# Patient Record
Sex: Female | Born: 1960 | Race: White | Hispanic: No | Marital: Married | Smoking: Never smoker
Health system: Southern US, Community
[De-identification: ages and names within clinical notes are randomized; demographics above are authoritative.]

## PROBLEM LIST (undated history)

## (undated) ENCOUNTER — Ambulatory Visit: Admission: EM | Discharge status: Absent without leave | Payer: BLUE CROSS/BLUE SHIELD

## (undated) DIAGNOSIS — E785 Hyperlipidemia, unspecified: Secondary | ICD-10-CM

## (undated) DIAGNOSIS — K635 Polyp of colon: Secondary | ICD-10-CM

## (undated) DIAGNOSIS — C4491 Basal cell carcinoma of skin, unspecified: Secondary | ICD-10-CM

## (undated) DIAGNOSIS — N809 Endometriosis, unspecified: Secondary | ICD-10-CM

## (undated) DIAGNOSIS — T7840XA Allergy, unspecified, initial encounter: Secondary | ICD-10-CM

## (undated) DIAGNOSIS — I10 Essential (primary) hypertension: Secondary | ICD-10-CM

## (undated) DIAGNOSIS — M8580 Other specified disorders of bone density and structure, unspecified site: Secondary | ICD-10-CM

## (undated) DIAGNOSIS — H348192 Central retinal vein occlusion, unspecified eye, stable: Secondary | ICD-10-CM

## (undated) DIAGNOSIS — I2510 Atherosclerotic heart disease of native coronary artery without angina pectoris: Secondary | ICD-10-CM

## (undated) DIAGNOSIS — I219 Acute myocardial infarction, unspecified: Secondary | ICD-10-CM

## (undated) HISTORY — DX: Basal cell carcinoma of skin, unspecified: C44.91

## (undated) HISTORY — DX: Endometriosis, unspecified: N80.9

## (undated) HISTORY — PX: COLONOSCOPY: SHX174

## (undated) HISTORY — DX: Allergy, unspecified, initial encounter: T78.40XA

## (undated) HISTORY — DX: Polyp of colon: K63.5

## (undated) HISTORY — PX: POLYPECTOMY: SHX149

## (undated) HISTORY — DX: Acute myocardial infarction, unspecified: I21.9

## (undated) HISTORY — DX: Essential (primary) hypertension: I10

## (undated) HISTORY — DX: Other specified disorders of bone density and structure, unspecified site: M85.80

## (undated) HISTORY — PX: APPENDECTOMY: SHX54

---

## 1987-06-10 DIAGNOSIS — N809 Endometriosis, unspecified: Secondary | ICD-10-CM

## 1987-06-10 HISTORY — DX: Endometriosis, unspecified: N80.9

## 1999-02-27 ENCOUNTER — Inpatient Hospital Stay (HOSPITAL_COMMUNITY): Admission: AD | Admit: 1999-02-27 | Discharge: 1999-02-27 | Payer: Self-pay | Admitting: Obstetrics & Gynecology

## 1999-03-14 ENCOUNTER — Inpatient Hospital Stay (HOSPITAL_COMMUNITY): Admission: AD | Admit: 1999-03-14 | Discharge: 1999-03-17 | Payer: Self-pay | Admitting: Obstetrics and Gynecology

## 1999-03-14 ENCOUNTER — Encounter (INDEPENDENT_AMBULATORY_CARE_PROVIDER_SITE_OTHER): Payer: Self-pay | Admitting: Specialist

## 1999-03-18 ENCOUNTER — Encounter (HOSPITAL_COMMUNITY): Admission: RE | Admit: 1999-03-18 | Discharge: 1999-06-16 | Payer: Self-pay | Admitting: Obstetrics & Gynecology

## 1999-05-20 ENCOUNTER — Other Ambulatory Visit: Admission: RE | Admit: 1999-05-20 | Discharge: 1999-05-20 | Payer: Self-pay | Admitting: Obstetrics & Gynecology

## 1999-10-30 ENCOUNTER — Ambulatory Visit (HOSPITAL_COMMUNITY): Admission: RE | Admit: 1999-10-30 | Discharge: 1999-10-30 | Payer: Self-pay | Admitting: *Deleted

## 1999-10-30 ENCOUNTER — Encounter: Payer: Self-pay | Admitting: *Deleted

## 2000-06-30 ENCOUNTER — Other Ambulatory Visit: Admission: RE | Admit: 2000-06-30 | Discharge: 2000-06-30 | Payer: Self-pay | Admitting: Obstetrics & Gynecology

## 2000-06-30 ENCOUNTER — Other Ambulatory Visit: Admission: RE | Admit: 2000-06-30 | Discharge: 2000-06-30 | Payer: Self-pay | Admitting: Family Medicine

## 2000-12-22 ENCOUNTER — Inpatient Hospital Stay (HOSPITAL_COMMUNITY): Admission: AD | Admit: 2000-12-22 | Discharge: 2000-12-22 | Payer: Self-pay | Admitting: Obstetrics & Gynecology

## 2001-02-23 ENCOUNTER — Inpatient Hospital Stay (HOSPITAL_COMMUNITY): Admission: AD | Admit: 2001-02-23 | Discharge: 2001-02-25 | Payer: Self-pay | Admitting: Obstetrics & Gynecology

## 2001-04-01 ENCOUNTER — Other Ambulatory Visit: Admission: RE | Admit: 2001-04-01 | Discharge: 2001-04-01 | Payer: Self-pay | Admitting: Obstetrics & Gynecology

## 2002-03-02 ENCOUNTER — Other Ambulatory Visit: Admission: RE | Admit: 2002-03-02 | Discharge: 2002-03-02 | Payer: Self-pay | Admitting: *Deleted

## 2002-07-20 ENCOUNTER — Encounter: Payer: Self-pay | Admitting: Internal Medicine

## 2003-03-13 ENCOUNTER — Other Ambulatory Visit: Admission: RE | Admit: 2003-03-13 | Discharge: 2003-03-13 | Payer: Self-pay | Admitting: *Deleted

## 2003-08-08 ENCOUNTER — Ambulatory Visit (HOSPITAL_COMMUNITY): Admission: RE | Admit: 2003-08-08 | Discharge: 2003-08-08 | Payer: Self-pay | Admitting: *Deleted

## 2004-03-21 ENCOUNTER — Other Ambulatory Visit: Admission: RE | Admit: 2004-03-21 | Discharge: 2004-03-21 | Payer: Self-pay | Admitting: *Deleted

## 2004-08-13 ENCOUNTER — Ambulatory Visit (HOSPITAL_COMMUNITY): Admission: RE | Admit: 2004-08-13 | Discharge: 2004-08-13 | Payer: Self-pay | Admitting: *Deleted

## 2004-10-22 ENCOUNTER — Encounter: Admission: RE | Admit: 2004-10-22 | Discharge: 2004-10-22 | Payer: Self-pay | Admitting: *Deleted

## 2005-03-25 ENCOUNTER — Other Ambulatory Visit: Admission: RE | Admit: 2005-03-25 | Discharge: 2005-03-25 | Payer: Self-pay | Admitting: Obstetrics and Gynecology

## 2006-03-31 ENCOUNTER — Other Ambulatory Visit: Admission: RE | Admit: 2006-03-31 | Discharge: 2006-03-31 | Payer: Self-pay | Admitting: Obstetrics & Gynecology

## 2006-09-22 ENCOUNTER — Encounter: Admission: RE | Admit: 2006-09-22 | Discharge: 2006-09-22 | Payer: Self-pay | Admitting: Obstetrics & Gynecology

## 2006-09-29 ENCOUNTER — Encounter: Admission: RE | Admit: 2006-09-29 | Discharge: 2006-09-29 | Payer: Self-pay | Admitting: Obstetrics & Gynecology

## 2007-04-08 ENCOUNTER — Other Ambulatory Visit: Admission: RE | Admit: 2007-04-08 | Discharge: 2007-04-08 | Payer: Self-pay | Admitting: Obstetrics & Gynecology

## 2007-04-08 LAB — HM PAP SMEAR

## 2007-05-10 HISTORY — PX: TOTAL ABDOMINAL HYSTERECTOMY W/ BILATERAL SALPINGOOPHORECTOMY: SHX83

## 2007-06-07 ENCOUNTER — Inpatient Hospital Stay (HOSPITAL_COMMUNITY): Admission: RE | Admit: 2007-06-07 | Discharge: 2007-06-09 | Payer: Self-pay | Admitting: Obstetrics & Gynecology

## 2007-06-07 ENCOUNTER — Encounter: Payer: Self-pay | Admitting: Obstetrics & Gynecology

## 2007-09-28 ENCOUNTER — Encounter: Admission: RE | Admit: 2007-09-28 | Discharge: 2007-09-28 | Payer: Self-pay | Admitting: Obstetrics & Gynecology

## 2008-06-19 ENCOUNTER — Telehealth: Payer: Self-pay | Admitting: Internal Medicine

## 2008-06-29 DIAGNOSIS — Z860100 Personal history of colon polyps, unspecified: Secondary | ICD-10-CM | POA: Insufficient documentation

## 2008-07-05 ENCOUNTER — Ambulatory Visit: Payer: Self-pay | Admitting: Internal Medicine

## 2008-07-05 DIAGNOSIS — K625 Hemorrhage of anus and rectum: Secondary | ICD-10-CM | POA: Insufficient documentation

## 2008-07-05 DIAGNOSIS — N809 Endometriosis, unspecified: Secondary | ICD-10-CM | POA: Insufficient documentation

## 2008-07-11 ENCOUNTER — Ambulatory Visit: Payer: Self-pay | Admitting: Internal Medicine

## 2008-10-24 ENCOUNTER — Encounter: Admission: RE | Admit: 2008-10-24 | Discharge: 2008-10-24 | Payer: Self-pay | Admitting: Obstetrics & Gynecology

## 2008-10-25 ENCOUNTER — Encounter: Admission: RE | Admit: 2008-10-25 | Discharge: 2008-10-25 | Payer: Self-pay | Admitting: Obstetrics & Gynecology

## 2009-10-25 ENCOUNTER — Encounter: Admission: RE | Admit: 2009-10-25 | Discharge: 2009-10-25 | Payer: Self-pay | Admitting: Obstetrics & Gynecology

## 2009-10-29 ENCOUNTER — Encounter: Admission: RE | Admit: 2009-10-29 | Discharge: 2009-10-29 | Payer: Self-pay | Admitting: Obstetrics & Gynecology

## 2009-10-29 LAB — HM DEXA SCAN: HM Dexa Scan: NORMAL

## 2010-06-30 ENCOUNTER — Encounter: Payer: Self-pay | Admitting: Obstetrics & Gynecology

## 2010-07-01 ENCOUNTER — Encounter: Payer: Self-pay | Admitting: Obstetrics & Gynecology

## 2010-07-09 NOTE — Procedures (Signed)
Summary: colonoscopy   Colonoscopy  Procedure date:  07/11/2008  Findings:      Location:  [facility].      PATIENT:  Kaylee Banks, Kaylee Banks  MR#:  [id number] BIRTHDATE:   07-19-1960   GENDER:   female  ENDOSCOPIST:   Hedwig Morton. Juanda Chance, MD Referred by: Holley Bouche, M.D.  PROCEDURE DATE:  07/11/2008 PROCEDURE:  Colonoscopy, diagnostic ASA CLASS:   Class I INDICATIONS: rectal bleeding anoscopic exam in the office failed to demonstrate site of the bleeding  hx of colon polyp 1993, no polyps 1995,2004  positive F hx of colon polyps  MEDICATIONS:    Versed 7 mg, Fentanyl 50 mcg  DESCRIPTION OF PROCEDURE:   After the risks benefits and alternatives of the procedure were thoroughly explained, informed consent was obtained.  Digital rectal exam was performed and revealed no rectal masses.   The LB PCF-H180AL [id number] endoscope was introduced through the anus and advanced to the cecum, which was identified by both the appendix and ileocecal valve, without limitations.  The quality of the prep was good, using MiraLax.  The instrument was then slowly withdrawn as the colon was fully examined. <<PROCEDUREIMAGES>>      <<OLD IMAGES>>  FINDINGS:  A normal appearing cecum, ileocecal valve, and appendiceal orifice were identified. The ascending, transverse, descending, sigmoid colon, and rectum appeared unremarkable (see image1, image2, image3, and image4). prominent anal papillae, no hems   Retroflexed views in the rectum revealed no abnormalities.    The scope was then withdrawn from the patient and the procedure completed.  COMPLICATIONS:   None  ENDOSCOPIC IMPRESSION:  1) Normal colon  no polyps RECOMMENDATIONS:  1) high fiber diet  2) hemoccults q 2-3 years  suspect anal source of bleeding which cleared up with topical steroids,  REPEAT EXAM:   In 7 year(s) for.   _______________________________ Hedwig Morton. Juanda Chance, MD  CC:

## 2010-07-09 NOTE — Assessment & Plan Note (Signed)
Summary: RECTAL BLEEDING, F/U FROM TRIAGE ON 06-19-08.      Kaylee Banks   History of Present Illness Visit Type: new patient Primary GI MD: Lina Sar MD Primary Provider: Holley Bouche, MD Chief Complaint: f/u triage with rectal bleeding x 2 weeks ago. Pt was recommended to start sitz bath, high fiber diet, and preperation H. Pt has done these things with a lot of improvement. Pt has not seen blood in 3 days. no abd pain, fever. History of Present Illness:   This is a 50 year old white female with an episode of  bright red blood per rectum which occurred 2 weeks ago and was associated with rectal pain and external irritation. She has been using Preparation H cream with much improvement. There has been no recurrence of bleeding and the pain has subsided. Patient has a history of a colon polyp seen on colonoscopy in 1993. She had subsequent colonoscopies in 1995 and 2004.  Patient has a family history of colon polyps. Her recall colonoscopy is scheduled for February 2011. In December 2008, patient underwent a total abdominal hysterectomy. She has been on calcium supplements which cause her to be constipated.   GI Review of Systems      Denies abdominal pain, acid reflux, belching, bloating, chest pain, dysphagia with liquids, dysphagia with solids, heartburn, loss of appetite, nausea, vomiting, vomiting blood, weight loss, and  weight gain.      Reports constipation.     Denies anal fissure, black tarry stools, change in bowel habit, diarrhea, diverticulosis, fecal incontinence, heme positive stool, hemorrhoids, irritable bowel syndrome, jaundice, light color stool, liver problems, rectal bleeding, and  rectal pain.     Prior Medications Reviewed Using: List Brought by Patient  Updated Prior Medication List: ASPIRIN 81 MG  TABS (ASPIRIN) one tablet by mouth once daily CALCIUM CARBONATE-VITAMIN D 600-400 MG-UNIT  TABS (CALCIUM CARBONATE-VITAMIN D) one tablet by mouth once daily CALCIUM 1200  1200-1000 MG-UNIT CHEW (CALCIUM CARBONATE-VIT D-MIN) one tablet by mouth once daily VITAMIN D 1000 UNIT TABS (CHOLECALCIFEROL) one tablet by mouth once daily MULTIVITAMINS   TABS (MULTIPLE VITAMIN) one tablet by mouth once daily as needed  Current Allergies (reviewed today): ! PERCOCET ! * DILAUDID  Past Medical History:    Reviewed history from 06/29/2008 and no changes required:       Current Problems:        ENDOMETRIOSIS (ICD-617.9)       COLONIC POLYPS, HX OF (ICD-V12.72)                 Past Surgical History:    Reviewed history from 06/29/2008 and no changes required:       C-Section x 2       Appendectomy 05/2007       Hysterectomy   Family History:    Reviewed history from 06/29/2008 and no changes required:       No FH of Colon Cancer:       Family History of Stomach Cancer: Paternal Grandfather       Family History of Colon Polyps: Father, Paternal Grandfather       Family History of Diabetes: Paternal Programme researcher, broadcasting/film/video       Family History of Uterine Cancer: Paternal Grandmother       Leiomyosarcoma of small bowel: Grandfather  Social History:    Reviewed history and no changes required:       Married       Systems developer       Patient has  never smoked.        Alcohol Use - no       Daily Caffeine Use       Illicit Drug Use - no   Risk Factors:  Tobacco use:  never Drug use:  no Alcohol use:  no   Review of Systems  The patient denies allergy/sinus, anemia, anxiety-new, arthritis/joint pain, back pain, blood in urine, breast changes/lumps, change in vision, confusion, cough, coughing up blood, depression-new, fainting, fatigue, fever, headaches-new, hearing problems, heart murmur, heart rhythm changes, itching, menstrual pain, muscle pains/cramps, night sweats, nosebleeds, pregnancy symptoms, shortness of breath, skin rash, sleeping problems, sore throat, swelling of feet/legs, swollen lymph glands, thirst - excessive , urination - excessive ,  urination changes/pain, urine leakage, vision changes, and voice change.     Vital Signs:  Patient Profile:   50 Years Old Female Height:     68 inches Weight:      141.25 pounds BMI:     21.55 Pulse rate:   74 / minute Pulse rhythm:   regular BP sitting:   126 / 76  (right arm) Cuff size:   regular  Vitals Entered By: Christie Nottingham CMA (July 05, 2008 8:38 AM)                  Physical Exam  General:     Well developed, well nourished, no acute distress. Lungs:     Clear throughout to auscultation. Heart:     Regular rate and rhythm; no murmurs, rubs or bruits. Abdomen:     Soft, nontender and nondistended. No masses, hepatosplenomegaly or hernias noted. Normal bowel sounds. Rectal:     perianal area appears normal. Rectal tone is normal. On digital exam, there is no irregularity of the anal canal and no tenderness. Anoscopic exam reveals very small internal hemorrhoids with no evidence of an anal fissure. There are minimal skin tags externally. There is no stool in the ampulla.    Impression & Recommendations:  Problem # 1:  COLONIC POLYPS, HX OF (ICD-V12.72) Acute episode of rectal bleeding. I am unable to demonstrate the source of bleeding today on anoscopic exam. Because it has been 2 weeks since the episode, it is possible that the fissure or hemorrhoid has healed, however,with her personal history of colon polyps and family history of colon polyps, she will need to have a colonoscopy to rule out the possibility of a left colon lesion. I have discussed this with the patient and she agrees to proceed with a colonoscopic exam. In the meantime, she will continue using her topical hemorrhoidal preparation. Orders: Colonoscopy (Colon)    Patient Instructions: 1)  continue Preparation H cream 2)  Schedule colonoscopy 3)  Calcium magnesium preparation to prevent constipating effect of the calcium. She may also take a stool softener.  4)  Copy Sent To:Dr  R.Harris    Prescriptions: DULCOLAX 5 MG  TBEC (BISACODYL) Day before procedure take 2 at 3pm and 2 at 8pm.  #4 x 0   Entered by:   Hortense Ramal CMA   Authorized by:   Hart Carwin MD   Signed by:   Hortense Ramal CMA on 07/05/2008   Method used:   Electronically to        [facility]  [address] [id number] (retail)       [address]       [place], [place]  [address]  Ph: [phone] or [phone]       Fax: [phone]   RxID:   [id number] REGLAN 10 MG  TABS (METOCLOPRAMIDE HCL) As per prep instructions.  #2 x 0   Entered by:   Hortense Ramal CMA   Authorized by:   Hart Carwin MD   Signed by:   Hortense Ramal CMA on 07/05/2008   Method used:   Electronically to        [id number] (retail)       [address]       [place], [place]  [address]       Ph: [phone] or [phone]       Fax: [phone]   RxID:   [id number] MIRALAX   POWD (POLYETHYLENE GLYCOL 3350) As per prep  instructions.  #255gm x 0   Entered by:   Hortense Ramal CMA   Authorized by:   Hart Carwin MD   Signed by:   Hortense Ramal CMA on 07/05/2008   Method used:   Electronically to        [id number] (retail)       [address]       [place], [place]  [address]       Ph: [phone] or [phone]       Fax: [phone]   RxID:   [id number]

## 2010-09-04 ENCOUNTER — Other Ambulatory Visit: Payer: Self-pay | Admitting: Family Medicine

## 2010-09-04 DIAGNOSIS — Z1231 Encounter for screening mammogram for malignant neoplasm of breast: Secondary | ICD-10-CM

## 2010-10-22 NOTE — Op Note (Signed)
NAMEWYNONA, Kaylee Banks           ACCOUNT NO.:  [id number]   MEDICAL RECORD NO.:  [id number]          PATIENT TYPE:  INP   LOCATION:  9320                          FACILITY:  [facility]   PHYSICIAN:  M. Leda Quail, MD  DATE OF BIRTH:  1961-05-12   DATE OF PROCEDURE:  06/07/2007  DATE OF DISCHARGE:                               OPERATIVE REPORT   PREOPERATIVE DIAGNOSES:  67. A 50 year old G2, P2 married white female with menorrhagia.  2. Dysfunctional uterine bleeding.  3. Dysmenorrhea.  4. Fibroids and probable adenomyosis noted on ultrasound.  5. History of mild endometriosis.   POSTOPERATIVE DIAGNOSES:  1. See above.  2. Extensive endometriosis which obliterated the posterior cul-de-sac,      was also present on the bowels, ovaries and fallopian tubes as well      as caused bladder scarring.   PROCEDURE:  TAH-BSO and extensive lysis of adhesions.   SURGEON:  M. Leda Quail, MD   ASSISTANT:  Meredeth Ide   ANESTHESIA:  General tracheal.   FINDINGS:  Extensive endometriosis as stated above.  Specifically there  was endometriosis present on small bowel.  The endometriosis had caused  obliteration of the posterior cul-de-sac with both ovaries were stuck to  the pelvic sidewall.  The bladder was stuck up on the uterus.  Both  fallopian tubes were enlarged and clubbed.   SPECIMENS:  Uterus, ovaries, cervix, tubes.   DISPOSITION:  Specimen sent pathology.   ESTIMATED BLOOD LOSS:  900 mL (this includes endometrioma fluid as  well).   INTRAOPERATIVE FLUIDS:  3100 mL of LR and 500 mL of Hespan.   URINE OUTPUT:  300 mL.   COMPLICATIONS:  None.   INDICATIONS:  Kaylee Banks is a very pleasant 50 year old G2, P2 married  white female who has a long history of with menorrhagia and  dysmenorrhea.  This has worsened significantly over the last year and  she has decided to proceed with definitive management.  She had I have  discussed routes of the surgery based on her  medical history, previous  history of 2 C-sections.  I felt the abdominal approach is the best.   PROCEDURE:  The patient was to the operating room.  She was placed on  the supine position.  General endotracheal anesthesia was administered  by anesthesia staff without difficulty.  Legs were bent a little bit  over a pillow.  Once the patient was asleep legs were placed in the frog-  leg position.  Abdomen, perineum, inner thighs and vagina prepped in  normal sterile fashion.  Foley catheter was inserted the bladder under  sterile conditions.  The legs were then straightened.  She was then  draped in normal sterile fashion.   Attention was turned the abdomen.  Previous C-section scars were noted.  A Pfannenstiel skin incision was made with a knife.  This was carried  down through the subcutaneous fat tissue.  Fascia then was identified  and nicked in the midline.  Fascial incision was extended laterally  using curved Mayo scissors.  Kocher clamps were applied to the superior  aspect of fascial incision.  The underlying rectus muscle dissected off  sharply.  In a similar fashion, the inferior aspect of the fascial  incision was elevated and the underlying rectus muscles were dissected  off sharply.  The peritoneum was identified.  It is elevated superiorly  and entered sharply.  The operator's index finger did sweep underneath  the peritoneum and no adhesions were noted.  The peritoneal  incision  was extended superiorly and inferiorly with care taken to note the  location of the bladder.   Survey of the abdomen at this point revealed a quite socked in uterus  with extensive endometriosis all around.  There was only half-dollar  size the area on the uterus at the fundus that appeared completely  normal.  Small bowel was attached to the fundus. The ovaries and  fallopian tubes at this point were not clearly identifiable and there  was extensive bladder adhesions onto the uterus  inferiorly.  No  retractor could be used at this point.  Deaver retractors were used  during the procedure.  Initially the patient's left side appeared to be  a little less adhesed and attention was initially turned to this side.  The left round ligament could be identified.  This was elevated suture  ligated #0 Vicryl.  The round ligament was incised.  There was fimbria  that could be noted on the left fallopian tube.  This was grasped with a  Babcock clamp and using sharp and blunt dissection, the fallopian tube  on the left side was ultimately dissected out.  The utero-ovarian  pedicle at this point could be identified at the complete edge of the  ovary could not.  Again sharp and blunt dissection was performed until  the left IP ligament could be identified and was clamped with Heaney  clamp.  This was transected and suture ligated #0 Vicryl and a second  stitch of #0 Vicryl.  At this point attention was turned inferiorly.  Sharp dissection was used to remove the dense adhesions of the bladder  or peritoneum covering the bladder off the uterus.  Once these initial  adhesions were cut, a true bladder flap was easily made.  The bladder  was pushed down the cervix and clear white glistening tissue of the  cervix was noted.  There was bowel superiorly on the uterus and this was  dissected off of both with sharp and blunt dissection as well.  At this  point skeletonization of the left uterine artery was possible.  This was  performed and the left uterine artery was clamped, transected and suture  ligated with two stitches of #0 Vicryl.   Attention was turned to the right side.  The round ligament was  identified and suture ligated #0 Vicryl.  The ovary and the fallopian  tube were stuck down the posterior cul-de-sac.  Extensive lysis of  adhesions was necessary to free this and identify the IP ligament.  Small bowel and the cecum was attached to the fundus.  Once the cecum  was freed, the  appendix appeared to be only a stump.  There was no bowel  content leakage.  Patient has not had an appendectomy but there was  really nothing but a stump present.  Once this was out of the way, the  IP ligament was identified it was clamped and transected and doubly  suture ligated with a free tie of #0 Vicryl and a stitch tie.  The  posterior cul-de-sac at this point could be dissected bluntly  with index  finger to separate the uterus from the cul-de-sac.  The base of cervix  was identified using this manner.  Again with further dissection the  right uterine artery could be identified.  This was clamped and suture  ligated #0 Vicryl.  Bladder was well out of the way at this point in the  procedure.  Then the cardinal ligaments could be serially clamped,  transected, and suture ligated with #0 Vicryl.  The left side was a  little freer than the right side.  The uterosacral ligaments at this  point were clamped, transected, and suture ligated with #0 Vicryl.  Curved Heaney clamp could be placed around the base of the cervix at  this point on the left side.  This was performed and then the pedicle  cut.  The vagina was entered at this point.  A Heaney stitch of #0  Vicryl was placed at this pedicle.  Then using Jorgenson scissors and  hugging the cervix, vaginal mucosa was circumferentially incised around  the cervix.  Multiple figure-of-eight sutures of #0 Vicryl were used to  close the cuff as cervix was quite bulbous.  At this point copious  amounts of warm normal saline were used to irrigate the pelvis.  There  was some small bleeding that was noted on the bladder flap and a figure-  of-eight suture of #2-0 Vicryl on an SH needle was necessary to obtain  hemostasis.  As well there was a small amount of bleeding on the cuff.  An additional figure-of-eight suture of #0 Vicryl was required to obtain  hemostasis.  Again the pelvis was irrigated.  It did appear relatively  dry but quite oozy  due to the amount of endometriosis was present.  The  bowel was visualized.  There did not appear to be any areas of weakened  wall edge of the bowel.  Then 5 mL of indigo carmine was given  intravenously as further irrigation was performed, blue dye was watched  for to ensure that there was no ureteral or bladder injury.  No injury  was identified.  Blue did come fairly quickly out of the bladder and  into the catheter bag.   At this point, an Interceed was placed into the pelvis and a #7 JP drain  was obtained.  The incision was made in the skin on the right side.  A  Kelly clamp was used to traverse the fascia and the JP drain was placed  in the pelvis.  This drain was sewn in place with 2-0 silk.   The peritoneum at this point closed with a running stitch of #0 Vicryl.  Two On-Q pump tubings were obtained.  The first placed subfascially on  the right side.  The tubing was attached to the skin using Steri-Strips.  The subvaginal tissue and rectus muscles were visualized.  No bleeding  noted.  The fascia was closed and the corners run to the midline  bilaterally.  A subcutaneous On-Q pump was then placed on the patient's  left.  At this point skin was closed with subcuticular stitch of #3-0  Vicryl.  This was done after the subcutaneous fat tissue was irrigated  and no bleeding was noted.  The skin was cleansed, benzoin and Steri-  Strips were applied.  Then 5 mL 1% lidocaine was instilled  subcutaneously and 10 mL of 1% lidocaine was instilled subfascially.   Sponge, lap, needle and sponge counts were correct x2.  The patient  tolerated  procedure well.  She was extubated and awakened from  anesthesia without difficulty.  She was taken to the recovery room in  stable condition.      Lum Keas, MD  Electronically Signed     MSM/MEDQ  D:  06/07/2007  T:  06/08/2007  Job:  [id number]

## 2010-10-22 NOTE — Discharge Summary (Signed)
Kaylee Banks, Kaylee Banks           ACCOUNT NO.:  [id number]   MEDICAL RECORD NO.:  [id number]          PATIENT TYPE:  INP   LOCATION:  9320                          FACILITY:  [facility]   PHYSICIAN:  M. Leda Quail, MD  DATE OF BIRTH:  1960-07-27   DATE OF ADMISSION:  06/07/2007  DATE OF DISCHARGE:  06/09/2007                               DISCHARGE SUMMARY   ADMISSION DIAGNOSES:  1. This is a 50 year old, G2 P2 married, white female with a history      of metromenorrhagia.  2. History of endometriosis.  3. History of retinal vein occlusion with a negative hematologic      workup.  4. History of cesarean section x2.   DISCHARGE DIAGNOSES:  1. This is a 50 year old, G2 P2 married, white female with a history      of metromenorrhagia.  2. History of endometriosis.  3. History of retinal vein occlusion with a negative hematologic      workup.  4. History of cesarean section x2.  5. Extensive endometriosis of the entire pelvis involving the ovaries,      bowels, anterior and posterior cul de sac, bladders and pelvic side      walls.   HISTORY OF PRESENT ILLNESS:  A written H&P is in the chart.  In brief,  [name] is a very nice 50 year old, white female who has a history  of metromenorrhagia.  She has decided to proceed with definitive  management via hysterectomy.  She and I discussed treatment options in  the clinic and she ultimately decided to proceed with a hysterectomy.  She has been counseled about risks and benefits.  She is here to proceed  today.   HOSPITAL COURSE:  The patient is admitted to same day surgery.  She is  taken to the operating room for TAH-BSO.  Lysis of extensive adhesions  was performed.  Surgery was lengthy because of the amount of  endometriosis and she had about 900 ml of blood loss, but she was quite  stable during the procedure and did well.  She did have an  intraoperative H&H drawn which was in the 10 range, but we decided, at  this point,  not to give her blood as she was quite hemodynamically  stable.  After surgery was completed, she was transferred to the  recovery room and, after appropriate time there, to the third floor.  She has an On-Q pump placed for pain.  She did have a JP on the right  side that was placed and a peritoneal drain.  She also had a Dilaudid  PCA for pain management.  She had a Foley catheter.  She was seen on the  evening of postoperative day 0.  She was doing well.  She had very quiet  bowel sounds.  She had excellent pain control.  She was having some  issues with nausea.  On the morning of postoperative day 1, she reports  the nausea had improved.  Pain was still well controlled.  Vital signs  were stable.  She was afebrile.  She had made about 3000 ml of clear  urine output.  Hemoglobin was 8.4, white count 8.9, platelets 193.  Electrolytes:  136 sodium, 3.3 potassium, 7 BUN, 0.7 creatinine, glucose  154.  Her Foley was removed and she was able to void well.  She had  excellent bowel sounds and she was advanced to a regular diet without  difficulty.  About noon, her PCA was removed and her nausea completely  resolved which lead [place] to believe that the nausea was resulting from the  Dilaudid, not anything else.  She was able to ambulate without  difficulty and, by the end of postoperative day 1, she was voiding well,  eating well, taking all pain medicines by mouth and ambulating without  difficulty.  She was seen on the morning of postoperative day 2 without  complaints.  She had not passed gas yet, but she continued to have  excellent bowel sounds.  She was having a little bit of itching which I  feel is probably due to the Percocet, so she was switched to oral  Vicodin for pain management.  At this point, she met all criteria for  discharge.  Her On-Q pumps were removed.  The tubing on the left, when  it was removed, did bleed some, but his was stopped easily with  pressure.  Her JP was removed  on the right and it was closed with  Dermabond.  At this point, I felt discharge was appropriate.   DISCHARGE INSTRUCTIONS:  These were provided in written and verbal form.  She will use Motrin 800 mg one every 8 hours p.r.n. pain and Vicodin  5/500 one to 2 p.o. q.4-6h. p.r.n. pain as need.  She will start on  Colace for a stool softener.  She will start Milk of Magnesia or MiraLax  on Friday if she has not had a bowel movement.  She will be seen in one  week in my office and she already has an appointment for this.  She is  to call us specifically with fever, increased vaginal bleeding, any  unusual vaginal discharge, redness or drainage from her incision.      Lum Keas, MD  Electronically Signed     MSM/MEDQ  D:  06/09/2007  T:  06/09/2007  Job:  [id number]

## 2010-10-25 NOTE — H&P (Signed)
[facility] of [facility]  Patient:    Kaylee Banks, Kaylee Banks Visit Number: [id number] MRN: [id number]          Service Type: OBS Location: [facility] Attending Physician:  Soledad Gerlach Dictated by:   Freddy Finner, M.D. Admit Date:  12/22/2000 Discharge Date: 12/22/2000                           History and Physical  ADMISSION DIAGNOSES:          1. Intrauterine pregnancy at term.                               2. Surgically scarred uterus.                               3. Refused vaginal birth after cesarean.  HISTORY:                      The patient is a 50 year old white married female gravida 2, para 1, who was delivered by cesarean for breech with her first pregnancy in October 2000.  She has had an uncomplicated prenatal course.  She is now admitted at term for a repeat cesarean delivery.  REVIEW OF SYSTEMS:            At this time is negative.  PAST MEDICAL HISTORY/FAMILY HISTORY:  Are recorded in detail in the  prenatal summary, and will not be repeated.  PHYSICAL EXAMINATION:  HEENT:                        Grossly within normal limits.  VITAL SIGNS:                  Blood pressure 120/76.  Weight is 186 pounds.  CHEST:                        Clear to auscultation.  HEART:                        Normal sinus rhythm without murmurs, rubs, or gallops.  ABDOMEN:                      Gravid.  Estimated fetal size of 8 pounds.  PELVIC:                       Cervix is long, posterior, closed at the internal os.  Vertex is floating.  EXTREMITIES:                  Without cyanosis, clubbing, or edema.  ASSESSMENT:                   Intrauterine pregnancy at term, with surgically                               scarred uterus, requesting repeat surgical                               delivery.  PLAN:  Cesarean delivery. Dictated by:   Freddy Finner, M.D. Attending Physician:  Soledad Gerlach DD:  02/22/01 TD:   02/22/01 Job: [id number]

## 2010-10-25 NOTE — Discharge Summary (Signed)
[facility]  Patient:    Kaylee Banks, Kaylee Banks Visit Number: [id number] MRN: [id number]          Service Type: OBS Location: 910A 9109 01 Attending Physician:  Minette Headland Dictated by:   Danie Chandler, R.N. Admit Date:  02/23/2001 Disc. Date: 02/25/01                             Discharge Summary  ADMISSION DIAGNOSES:          Intrauterine pregnancy at term with surgically scarred uterus.  DISCHARGE DIAGNOSIS:          Intrauterine pregnancy at term with surgically scarred uterus.  PROCEDURES:                   On February 23, 2001, repeat low transverse cervical cesarean section.  HISTORY OF PRESENT ILLNESS:   Please see dictated H&P.  HOSPITAL COURSE:              The patient was taken to the operating room and underwent the above-named procedure without complications.  This was productive of a viable female infant with Apgars of 9 at one minute and 9 at five minutes and an arterial cord pH of 7.40.  Postoperatively on day #1 the patient was without complaint.  Her hemoglobin on this day was 11.2, hematocrit 31.9, and white blood cell count 8.4.  On postoperative day #2 she requested discharge home.  She had a good return of bowel function and was tolerating a regular diet.  She was also ambulating well without difficulty and had good pain control.  CONDITION ON DISCHARGE:       Good.  DIET:                         Regular as tolerated.  ACTIVITY:                     No heavy lifting, no driving, no vaginal entry.  FOLLOW-UP:                    She is to follow up in the office for staple removal.  DISCHARGE INSTRUCTIONS:       She is to call for temperature greater than 100 degrees, persistent nausea or vomiting, heavy vaginal bleeding, and/or redness or drainage from the incision site.  DISCHARGE MEDICATIONS:        1. Prenatal vitamin 1 p.o. q.d.                               2. Tylox #25 1-2 p.o. q.4-6h. p.r.n.  pain. Dictated by:   Danie Chandler, R.N. Attending Physician:  Minette Headland DD:  02/25/01 TD:  02/25/01 Job: [id number]

## 2010-10-25 NOTE — Op Note (Signed)
[facility] of [facility]  Patient:    Kaylee Banks, Kaylee Banks Visit Number: [id number] MRN: [id number]          Service Type: OBS Location: 910A 9109 01 Attending Physician:  Minette Headland Dictated by:   Freddy Finner, M.D. Proc. Date: 02/23/01 Admit Date:  02/23/2001                             Operative Report  PREOPERATIVE DIAGNOSIS:  Intrauterine pregnancy at term, surgically scarred uterus.  POSTOPERATIVE DIAGNOSES:  Intrauterine pregnancy at term, surgically scarred uterus with delivery of viable female infant, Apgars of 9 and 9, by Dr. Alison Murray who was in attendance.  Arterial core pH 7.40.  Birth weight 9 pounds 1 ounce.  PROCEDURE:  Repeat low transverse cervical cesarean section.  SURGEON:  Freddy Finner, M.D.  ANESTHESIA: Spinal.  ESTIMATED BLOOD LOSS:  800 cc.  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  COMPLICATIONS:  None.  DETAILS OF THE PRESENCE ILLNESS:  Recorded in the admission note.  The patient was admitted on the morning of surgery and brought to the operating room and placed under adequate spinal anesthesia, and placed in the dorsal recumbent position with elevation of the right hip by 15 degrees. Abdomen was prepped in the usual fashion.  Foley catheter was placed by using sterile technique.  Sterile drapes were applied.  A transverse incision was made through an old lower abdominal transverse scar and carried sharply down to fascia.  The fascia was entered sharply and extended to the extent of the skin incision.  The rectus sheath was developed superiorly and inferiorly with blunt and sharp dissection.  Rectus muscles were divided in the midline.  Peritoneum was entered sharply and extended to the extent of the skin incision.  Bladder blade was placed.  Transverse incision was made in the visceral peritoneum overlying the lower uterine segment.  The lower segment  was very thin.  It was entered sharply without difficulty and extended bluntly in a transverse direction.  Amniotic fluid was clear.  Using a vacuum extractor an easy delivery of a viable female was accomplished.  Cord blood was obtained for arterial gases and for routine venous cord sampling.  Placenta and other parts of conception were removed from the uterus.  This was confirmed by manual exploration of the uterine cavity. Edges of the incision were grasped with a ring forceps. Incision in the lower segment was closed with running locking 0 monacryl suture.  Bladder flap was reapproximated with an interrupted figure-of-eight 0 monacryl.  Irrigation was carried out.  Hemostasis was complete.  There were adhesions in the right adnexa but no visible evidence of an ovary after numerous attempts to find the ovary.   There was a normal left ovary and left fallopian tube.  The uterus itself was normal.  All packs, needles, and instruments were removed.  Counts were correct.  Abdominal incision was closed in layers, running 0 monacryl was used to approximate the peritoneum and reapproximate the rectus muscles. Fascia was closed with running 0 PDS.  Skin was closed with wide skin staples and 1/4 inch Steri-Strips.  The patient tolerated the operative procedure well and was taken to recovery in good condition. Dictated by:   Freddy Finner, M.D. Attending Physician:  Minette Headland DD:  02/23/01 TD:  02/23/01 Job: [id number]

## 2010-10-28 ENCOUNTER — Ambulatory Visit
Admission: RE | Admit: 2010-10-28 | Discharge: 2010-10-28 | Disposition: A | Discharge status: Home / Self Care | Payer: BC Managed Care – PPO | Source: Ambulatory Visit | Attending: Family Medicine | Admitting: Family Medicine

## 2010-10-28 DIAGNOSIS — Z1231 Encounter for screening mammogram for malignant neoplasm of breast: Secondary | ICD-10-CM

## 2011-03-14 LAB — PREGNANCY, URINE: Preg Test, Ur: NEGATIVE

## 2011-03-14 LAB — CROSSMATCH
ABO/RH(D): A POS
Antibody Screen: NEGATIVE

## 2011-03-14 LAB — BASIC METABOLIC PANEL
BUN: 10
BUN: 7
CO2: 29
CO2: 29
Calcium: 7.7 — ABNORMAL LOW
Calcium: 9
Chloride: 103
Chloride: 103
Creatinine, Ser: 0.66
Creatinine, Ser: 0.72
GFR calc Af Amer: >60
GFR calc Af Amer: >60
GFR calc non Af Amer: >60
GFR calc non Af Amer: >60
Glucose, Bld: 154 — ABNORMAL HIGH
Glucose, Bld: 93
Potassium: 3.3 — ABNORMAL LOW
Potassium: 3.3 — ABNORMAL LOW
Sodium: 136
Sodium: 136

## 2011-03-14 LAB — CBC
HCT: 24 — ABNORMAL LOW
HCT: 37.1
Hemoglobin: 12.9
Hemoglobin: 8.4 — ABNORMAL LOW
MCHC: 34.7
MCHC: 35
MCV: 88.5
MCV: 88.8
Platelets: 193
Platelets: 248
RBC: 2.71 — ABNORMAL LOW
RBC: 4.18
RDW: 12.3
RDW: 12.3
WBC: 5.9
WBC: 8.9

## 2011-03-14 LAB — URINALYSIS, ROUTINE W REFLEX MICROSCOPIC
Bilirubin Urine: NEGATIVE
Glucose, UA: NEGATIVE
Ketones, ur: NEGATIVE
Leukocytes, UA: NEGATIVE
Nitrite: NEGATIVE
Protein, ur: NEGATIVE
Specific Gravity, Urine: 1.025
Urobilinogen, UA: 0.2
pH: 6

## 2011-03-14 LAB — URINE MICROSCOPIC-ADD ON

## 2011-03-14 LAB — HEMOGLOBIN AND HEMATOCRIT, BLOOD
HCT: 27.3 — ABNORMAL LOW
HCT: 28.5 — ABNORMAL LOW
Hemoglobin: 10 — ABNORMAL LOW
Hemoglobin: 9.7 — ABNORMAL LOW

## 2011-03-14 LAB — ABO/RH: ABO/RH(D): A POS

## 2011-05-09 ENCOUNTER — Emergency Department (HOSPITAL_BASED_OUTPATIENT_CLINIC_OR_DEPARTMENT_OTHER)
Admission: EM | Admit: 2011-05-09 | Discharge: 2011-05-09 | Disposition: A | Discharge status: Home / Self Care | Payer: BC Managed Care – PPO | Attending: Emergency Medicine | Admitting: Emergency Medicine

## 2011-05-09 ENCOUNTER — Emergency Department (INDEPENDENT_AMBULATORY_CARE_PROVIDER_SITE_OTHER): Payer: BC Managed Care – PPO

## 2011-05-09 DIAGNOSIS — W19XXXA Unspecified fall, initial encounter: Secondary | ICD-10-CM

## 2011-05-09 DIAGNOSIS — S92902A Unspecified fracture of left foot, initial encounter for closed fracture: Secondary | ICD-10-CM

## 2011-05-09 DIAGNOSIS — Y92009 Unspecified place in unspecified non-institutional (private) residence as the place of occurrence of the external cause: Secondary | ICD-10-CM | POA: Insufficient documentation

## 2011-05-09 DIAGNOSIS — S92309A Fracture of unspecified metatarsal bone(s), unspecified foot, initial encounter for closed fracture: Secondary | ICD-10-CM | POA: Insufficient documentation

## 2011-05-09 DIAGNOSIS — W010XXA Fall on same level from slipping, tripping and stumbling without subsequent striking against object, initial encounter: Secondary | ICD-10-CM | POA: Insufficient documentation

## 2011-05-09 MED ORDER — IBUPROFEN 800 MG PO TABS
800.0000 mg | ORAL_TABLET | Freq: Once | ORAL | Status: AC
  Administered 2011-05-09: 800 mg via ORAL
  Filled 2011-05-09: qty 1

## 2011-05-09 MED ORDER — IBUPROFEN 800 MG PO TABS: 800.0000 mg | ORAL_TABLET | Freq: Three times a day (TID) | ORAL | Status: AC

## 2011-05-09 NOTE — ED Provider Notes (Signed)
History     CSN: [id number] Arrival date & time: 05/09/2011  9:07 PM   First MD Initiated Contact with Patient 05/09/11 2146      Chief Complaint  Patient presents with  . Fall  . Foot Pain    left foot   Patient fell in her driveway and twisted her foot is. No other injuries noted. X-rays completed. Prior to the patient being seen, which did confirm a fracture at the base of the fifth metatarsal bone. (Consider location/radiation/quality/duration/timing/severity/associated sxs/prior treatment) HPI  History reviewed. No pertinent past medical history.  Past Surgical History  Procedure Date  . Cesarean section     x2  . Abdominal hysterectomy     History reviewed. No pertinent family history.  History  Substance Use Topics  . Smoking status: Never Smoker   . Smokeless tobacco: Never Used  . Alcohol Use: No    OB History    Grav Para Term Preterm Abortions TAB SAB Ect Mult Living                  Review of Systems  All other systems reviewed and are negative.    Allergies  Hydromorphone hcl and Oxycodone-acetaminophen  Home Medications   Current Outpatient Rx  Name Route Sig Dispense Refill  . VITAMIN D 1000 UNITS PO TABS Oral Take 1,000 Units by mouth daily.      Marland Kitchen ONE-DAILY MULTI VITAMINS PO TABS Oral Take 1 tablet by mouth daily.        BP 145/89  Pulse 83  Temp(Src) 99.2 F (37.3 C) (Oral)  Resp 18  Ht 5\' 8"  (1.727 m)  Wt 132 lb (59.875 kg)  BMI 20.07 kg/m2  SpO2 100%  Physical Exam  Constitutional: She appears well-developed and well-nourished.  HENT:  Head: Normocephalic.  Eyes: Pupils are equal, round, and reactive to light.  Cardiovascular: Normal heart sounds.   Pulmonary/Chest: Breath sounds normal.  Abdominal: Soft.  Musculoskeletal: Normal range of motion. She exhibits edema and tenderness.       Diffuse tenderness along the lateral aspect of the foot.  Neurological: She is alert.  Skin: Skin is warm and dry.    ED Course    Procedures (including critical care time)  Labs Reviewed - No data to display Dg Foot Complete Left  05/09/2011  *RADIOLOGY REPORT*  Clinical Data: Fall and pain.  LEFT FOOT - COMPLETE 3+ VIEW  Comparison: None  Findings: Three views of the left foot demonstrates a minimally- displaced fracture at the base of the fifth metatarsal bone. Fracture is best seen on the lateral view.  There is a prominent spur along the plantar aspect of the calcaneus.  No other definite foot fractures.  IMPRESSION: Fracture at the base of the fifth metatarsal bone.  Original Report Authenticated By: Richarda Overlie, M.D.     No diagnosis found.    MDM  Pt is seen and examined;  Initial history and physical completed.  Will follow.    Xrays or radiologic studies  reviewed by myself, interpreted by Radiologist.          Lorelle Gibbs. Patrica Duel, MD 05/09/11 2148

## 2011-05-09 NOTE — ED Notes (Signed)
Pt states that she fell in her driveway and that she twisted her foot.  Pt states that she did not sustain any other injuries during this incident.  No pain medications taken pta.  Pain extends across top of the foot.  8/10 pain

## 2011-05-21 ENCOUNTER — Other Ambulatory Visit: Payer: Self-pay | Admitting: Dermatology

## 2011-08-23 ENCOUNTER — Other Ambulatory Visit: Payer: Self-pay

## 2011-08-23 ENCOUNTER — Emergency Department (INDEPENDENT_AMBULATORY_CARE_PROVIDER_SITE_OTHER): Payer: BC Managed Care – PPO

## 2011-08-23 ENCOUNTER — Inpatient Hospital Stay (HOSPITAL_BASED_OUTPATIENT_CLINIC_OR_DEPARTMENT_OTHER)
Admission: EM | Admit: 2011-08-23 | Discharge: 2011-08-26 | DRG: 121 | Disposition: A | Discharge status: Home / Self Care | Payer: BC Managed Care – PPO | Attending: Cardiology | Admitting: Cardiology

## 2011-08-23 ENCOUNTER — Encounter (HOSPITAL_BASED_OUTPATIENT_CLINIC_OR_DEPARTMENT_OTHER): Payer: Self-pay

## 2011-08-23 DIAGNOSIS — I214 Non-ST elevation (NSTEMI) myocardial infarction: Principal | ICD-10-CM

## 2011-08-23 DIAGNOSIS — R079 Chest pain, unspecified: Secondary | ICD-10-CM

## 2011-08-23 DIAGNOSIS — Z79899 Other long term (current) drug therapy: Secondary | ICD-10-CM

## 2011-08-23 DIAGNOSIS — I2510 Atherosclerotic heart disease of native coronary artery without angina pectoris: Secondary | ICD-10-CM | POA: Diagnosis present

## 2011-08-23 DIAGNOSIS — E785 Hyperlipidemia, unspecified: Secondary | ICD-10-CM

## 2011-08-23 DIAGNOSIS — I472 Ventricular tachycardia, unspecified: Secondary | ICD-10-CM | POA: Diagnosis present

## 2011-08-23 DIAGNOSIS — I493 Ventricular premature depolarization: Secondary | ICD-10-CM

## 2011-08-23 DIAGNOSIS — I4729 Other ventricular tachycardia: Secondary | ICD-10-CM | POA: Diagnosis present

## 2011-08-23 HISTORY — DX: Atherosclerotic heart disease of native coronary artery without angina pectoris: I25.10

## 2011-08-23 HISTORY — DX: Central retinal vein occlusion, unspecified eye, stable: H34.8192

## 2011-08-23 HISTORY — DX: Hyperlipidemia, unspecified: E78.5

## 2011-08-23 LAB — BASIC METABOLIC PANEL
BUN: 23 mg/dL (ref 6–23)
CO2: 27 mEq/L (ref 19–32)
Calcium: 9.9 mg/dL (ref 8.4–10.5)
Chloride: 102 mEq/L (ref 96–112)
Creatinine, Ser: 0.7 mg/dL (ref 0.50–1.10)
GFR calc Af Amer: >90 mL/min (ref >90)
GFR calc non Af Amer: >90 mL/min (ref >90)
Glucose, Bld: 115 mg/dL — ABNORMAL HIGH (ref 70–99)
Potassium: 3.6 mEq/L (ref 3.5–5.1)
Sodium: 140 mEq/L (ref 135–145)

## 2011-08-23 LAB — MRSA PCR SCREENING: MRSA by PCR: NEGATIVE

## 2011-08-23 LAB — CARDIAC PANEL(CRET KIN+CKTOT+MB+TROPI)
CK, MB: 10.2 ng/mL (ref 0.3–4.0)
Relative Index: 5.5 — ABNORMAL HIGH (ref 0.0–2.5)
Total CK: 185 U/L — ABNORMAL HIGH (ref 7–177)
Troponin I: 2.14 ng/mL (ref <0.30)

## 2011-08-23 LAB — CBC
HCT: 40.1 % (ref 36.0–46.0)
Hemoglobin: 14.1 g/dL (ref 12.0–15.0)
MCH: 30.8 pg (ref 26.0–34.0)
MCHC: 35.2 g/dL (ref 30.0–36.0)
MCV: 87.6 fL (ref 78.0–100.0)
Platelets: 154 10*3/uL (ref 150–400)
RBC: 4.58 MIL/uL (ref 3.87–5.11)
RDW: 11.7 % (ref 11.5–15.5)
WBC: 8.5 10*3/uL (ref 4.0–10.5)

## 2011-08-23 LAB — APTT: aPTT: 30 seconds (ref 24–37)

## 2011-08-23 LAB — PROTIME-INR
INR: 0.93 (ref 0.00–1.49)
Prothrombin Time: 12.7 seconds (ref 11.6–15.2)

## 2011-08-23 MED ORDER — MORPHINE SULFATE 2 MG/ML IJ SOLN
2.0000 mg | Freq: Once | INTRAMUSCULAR | Status: AC
  Administered 2011-08-23: 2 mg via INTRAVENOUS
  Filled 2011-08-23: qty 1

## 2011-08-23 MED ORDER — NITROGLYCERIN 0.4 MG SL SUBL: 0.4000 mg | SUBLINGUAL_TABLET | SUBLINGUAL | Status: DC | PRN

## 2011-08-23 MED ORDER — HEPARIN BOLUS VIA INFUSION
4000.0000 [IU] | Freq: Once | INTRAVENOUS | Status: AC
  Administered 2011-08-23: 4000 [IU] via INTRAVENOUS

## 2011-08-23 MED ORDER — ONDANSETRON HCL 4 MG/2ML IJ SOLN
4.0000 mg | Freq: Four times a day (QID) | INTRAMUSCULAR | Status: DC | PRN
  Administered 2011-08-24: 4 mg via INTRAVENOUS
  Filled 2011-08-23: qty 2

## 2011-08-23 MED ORDER — SODIUM CHLORIDE 0.9 % IV SOLN: 250.0000 mL | INTRAVENOUS | Status: DC | PRN

## 2011-08-23 MED ORDER — HEPARIN (PORCINE) IN NACL 100-0.45 UNIT/ML-% IJ SOLN
850.0000 [IU]/h | INTRAMUSCULAR | Status: DC
  Administered 2011-08-23: 1000 [IU]/h via INTRAVENOUS
  Administered 2011-08-24: 850 [IU]/h via INTRAVENOUS
  Filled 2011-08-23 (×3): qty 250

## 2011-08-23 MED ORDER — SODIUM CHLORIDE 0.9 % IV BOLUS (SEPSIS)
1000.0000 mL | Freq: Once | INTRAVENOUS | Status: AC
  Administered 2011-08-23: 1000 mL via INTRAVENOUS

## 2011-08-23 MED ORDER — ASPIRIN 81 MG PO CHEW
243.0000 mg | CHEWABLE_TABLET | Freq: Once | ORAL | Status: AC
  Administered 2011-08-23: 243 mg via ORAL
  Filled 2011-08-23: qty 3

## 2011-08-23 MED ORDER — NITROGLYCERIN IN D5W 200-5 MCG/ML-% IV SOLN
INTRAVENOUS | Status: AC
  Administered 2011-08-23: 10 ug
  Filled 2011-08-23: qty 250

## 2011-08-23 MED ORDER — ATORVASTATIN CALCIUM 20 MG PO TABS
20.0000 mg | ORAL_TABLET | Freq: Every day | ORAL | Status: DC
  Administered 2011-08-24 – 2011-08-25 (×2): 20 mg via ORAL
  Filled 2011-08-23 (×3): qty 1

## 2011-08-23 MED ORDER — SODIUM CHLORIDE 0.9 % IJ SOLN: 3.0000 mL | INTRAMUSCULAR | Status: DC | PRN

## 2011-08-23 MED ORDER — ONDANSETRON HCL 4 MG/2ML IJ SOLN
4.0000 mg | Freq: Once | INTRAMUSCULAR | Status: AC
  Administered 2011-08-23: 4 mg via INTRAVENOUS
  Filled 2011-08-23: qty 2

## 2011-08-23 MED ORDER — METOPROLOL TARTRATE 25 MG PO TABS
25.0000 mg | ORAL_TABLET | Freq: Two times a day (BID) | ORAL | Status: DC
  Administered 2011-08-24 – 2011-08-26 (×4): 25 mg via ORAL
  Filled 2011-08-23 (×7): qty 1

## 2011-08-23 MED ORDER — ASPIRIN EC 81 MG PO TBEC
81.0000 mg | DELAYED_RELEASE_TABLET | Freq: Every day | ORAL | Status: DC
  Administered 2011-08-24: 81 mg via ORAL
  Filled 2011-08-23 (×2): qty 1

## 2011-08-23 MED ORDER — NITROGLYCERIN IN D5W 200-5 MCG/ML-% IV SOLN
3.0000 ug/min | INTRAVENOUS | Status: DC
  Administered 2011-08-24: 30 ug/min via INTRAVENOUS
  Filled 2011-08-23: qty 250

## 2011-08-23 MED ORDER — NITROGLYCERIN 0.4 MG SL SUBL
0.4000 mg | SUBLINGUAL_TABLET | Freq: Once | SUBLINGUAL | Status: AC
  Administered 2011-08-23: 0.4 mg via SUBLINGUAL
  Filled 2011-08-23: qty 25

## 2011-08-23 MED ORDER — SODIUM CHLORIDE 0.9 % IJ SOLN
3.0000 mL | Freq: Two times a day (BID) | INTRAMUSCULAR | Status: DC
  Administered 2011-08-24 (×2): 3 mL via INTRAVENOUS

## 2011-08-23 NOTE — ED Provider Notes (Addendum)
History    This chart was scribed for Kaylee Banks A. Patrica Duel, MD, MD by Smitty Pluck. The patient was seen in room [facility] and the patient's care was started at 6:00PM.   CSN: [id number]  Arrival date & time 08/23/11  1748   First MD Initiated Contact with Patient 08/23/11 1758      No chief complaint on file.   (Consider location/radiation/quality/duration/timing/severity/associated sxs/prior treatment) Patient is a 51 y.o. female presenting with chest pain. The history is provided by the patient.  Chest Pain    Kaylee Banks is a 51 y.o. female who presents to the Emergency Department complaining of moderate chest pain. Pt was at grocery store and felt her arms getting weak then getting fullness in chest. Opt reports having tightness and fullness in chest. Pt has had blood clot in eye. Pt denies any hx of heart problems. Pt had grandmother with premature heart disease. She describes the chest fullness 5/10. Pt reports having minimal nausea. Pt denies pain in back, diaphoresis and SOB. She has not had any strenuous activity recently. The pain has subsided somewhat but the fullness is still present. Pt took aspirin at home. Pt has hx of PVC. PCP is Shanda Bumps   No past medical history on file.  Past Surgical History  Procedure Date  . Cesarean section     x2  . Abdominal hysterectomy     No family history on file.  History  Substance Use Topics  . Smoking status: Never Smoker   . Smokeless tobacco: Never Used  . Alcohol Use: No    OB History    Grav Para Term Preterm Abortions TAB SAB Ect Mult Living                  Review of Systems  Cardiovascular: Positive for chest pain.  All other systems reviewed and are negative.   10 Systems reviewed and are negative for acute change except as noted in the HPI.  Allergies  Hydromorphone hcl and Oxycodone-acetaminophen  Home Medications   Current Outpatient Rx  Name Route Sig Dispense Refill  . VITAMIN D 1000 UNITS  PO TABS Oral Take 1,000 Units by mouth daily.      Marland Kitchen ONE-DAILY MULTI VITAMINS PO TABS Oral Take 1 tablet by mouth daily.        There were no vitals taken for this visit.  Physical Exam  Nursing note and vitals reviewed. Constitutional: She is oriented to person, place, and time. She appears well-developed and well-nourished. No distress.  HENT:  Head: Normocephalic and atraumatic.  Eyes: Conjunctivae are normal. Pupils are equal, round, and reactive to light.  Neck: Normal range of motion. Neck supple.  Cardiovascular: Normal rate, regular rhythm and normal heart sounds.   Pulmonary/Chest: Effort normal and breath sounds normal. No respiratory distress.  Neurological: She is alert and oriented to person, place, and time.  Skin: Skin is warm and dry. She is not diaphoretic.  Psychiatric: She has a normal mood and affect. Her behavior is normal.    ED Course  Procedures (including critical care time) DIAGNOSTIC STUDIES: Oxygen Saturation is 100% on room air, normal by my interpretation.    COORDINATION OF CARE: 6:05PM EDP discusses pt ED treatment with pt and pt's husband  6:05PM EDP ordered medication: Nitrostat 0.4 mg, NaCl 0.9% bolus, aspirin 243 mg     Labs Reviewed  CBC  APTT  PROTIME-INR  CARDIAC PANEL(CRET KIN+CKTOT+MB+TROPI)  BASIC METABOLIC PANEL   Dg Chest 2  View  08/23/2011  *RADIOLOGY REPORT*  Clinical Data: Chest pain  CHEST - 2 VIEW  Comparison: None.  Findings: Cardiomediastinal silhouette is unremarkable.  No acute infiltrate or pleural effusion.  No pulmonary edema.  Bony thorax is unremarkable.  IMPRESSION: No active disease.  Original Report Authenticated By: Natasha Mead, M.D.     No diagnosis found.    MDM  Pt is seen and examined;  Initial history and physical completed.  Will follow.   I personally performed the services described in this documentation, which was scribed in my presence.  The recorded information has been reviewed and considered.  Emine Lopata A. Patrica Duel, MD.  @ATTPRO @   Initial EKG was reviewed, which did show nonspecific ST changes in the lateral leads. There are also multiple PVCs.  Workup has been ordered to include cardiac enzymes, panel, stat chest x-ray, serial EKGs. Patient is already on aspirin and 1 nitroglycerin. We'll plan on transferring patient to [facility] initial workup is completed.   Date: 08/23/2011  Rate: 76  Rhythm: normal sinus rhythm  QRS Axis: left  Intervals: normal  ST/T Wave abnormalities: ST depressions inferiorly and ST depressions laterally  Conduction Disutrbances:multiple runs of PVC's  Narrative Interpretation:   Old EKG Reviewed: none available  Will continue to follow closely.    Results for orders placed during the hospital encounter of 08/23/11  CARDIAC PANEL(CRET KIN+CKTOT+MB+TROPI)      Component Value Range   Total CK PENDING  7 - 177 (U/L)   CK, MB 10.2 (*) 0.3 - 4.0 (ng/mL)   Troponin I 2.14 (*) <0.30 (ng/mL)   Relative Index PENDING  0.0 - 2.5   CBC      Component Value Range   WBC 8.5  4.0 - 10.5 (K/uL)   RBC 4.58  3.87 - 5.11 (MIL/uL)   Hemoglobin 14.1  12.0 - 15.0 (g/dL)   HCT 16.1  09.6 - 04.5 (%)   MCV 87.6  78.0 - 100.0 (fL)   MCH 30.8  26.0 - 34.0 (pg)   MCHC 35.2  30.0 - 36.0 (g/dL)   RDW 40.9  81.1 - 91.4 (%)   Platelets 154  150 - 400 (K/uL)  APTT      Component Value Range   aPTT 30  24 - 37 (seconds)  PROTIME-INR      Component Value Range   Prothrombin Time 12.7  11.6 - 15.2 (seconds)   INR 0.93  0.00 - 1.49    Dg Chest 2 View  08/23/2011  *RADIOLOGY REPORT*  Clinical Data: Chest pain  CHEST - 2 VIEW  Comparison: None.  Findings: Cardiomediastinal silhouette is unremarkable.  No acute infiltrate or pleural effusion.  No pulmonary edema.  Bony thorax is unremarkable.  IMPRESSION: No active disease.  Original Report Authenticated By: Natasha Mead, M.D.      Discussed with Cardiology.  Will start heparin and nitro paste. Patient remains very stable  and was given. Importance pertinent information. Will be transferred to [facility] cone in stable condition to the CCU          Hale Chalfin A. Patrica Duel, MD 08/23/11 1900  CRITICAL CARE Performed by: Orlene Och.   Total critical care time: 30   Critical care time was exclusive of separately billable procedures and treating other patients.  Critical care was necessary to treat or prevent imminent or life-threatening deterioration.  Critical care was time spent personally by me on the following activities: development of treatment plan with patient and/or surrogate as well  as nursing, discussions with consultants, evaluation of patient's response to treatment, examination of patient, obtaining history from patient or surrogate, ordering and performing treatments and interventions, ordering and review of laboratory studies, ordering and review of radiographic studies, pulse oximetry and re-evaluation of patient's condition.   Doralee Kocak A. Patrica Duel, MD 08/23/11 [id number]

## 2011-08-23 NOTE — H&P (Signed)
CARDIOLOGY ADMISSION NOTE  Patient ID: Kaylee Banks MRN: [id number] DOB/AGE: [birth year] 49 y.o.  Admit date: 08/23/2011 Primary Physician   Holley Bouche, MD Primary Cardiologist   New Chief Complaint    Chest Pain/Arm Pain  HPI:  The patient presents valuation chest and arm pain.  She has no history of coronary artery disease or other heart disease. She has a mild dyslipidemia which has not required medical care and family history second-degree relatives with early onset heart disease but otherwise no cardiovascular risk factors. She was doing well in her usual state of health with the exception of mild fatigue over the last couple of days. Tonight at about 4 PM she was walking through a grocery store pushing a cart. Her arms began to ache and feel very heavy. She had some pressure in her chest. The peak was about 5/10.  She and never had this before. She finished her shopping and drove home. She was eventually convinced to come to the emergency room. She did take aspirin and was driven by private car to the [facility] ER.  There she had no acute ST segment changes on her EKG though she did have ventricular ectopy with runs of idioventricular rhythm. Her first set of cardiac markers are positive with a troponin of 2.14. Total CK was 185 with an MB of 10.2. She was treated with morphine heparin nitroglycerin and aspirin. Her pain went down to about 1 and is currently still there was some mild 2/10 discomfort. She has a very mild nausea but no vomiting. She didn't feel particularly short of breath. In any diaphoresis.  Otherwise the patient has been well. She otherwise does not describe chest discomfort, neck or arm discomfort. She doesn't have palpitations, presyncope or syncope. She never has PND or orthopnea.  Past Medical History  Diagnosis Date  . 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 Cesarean section     x2  . Abdominal  hysterectomy     Allergies  Allergen Reactions  . Hydromorphone Hcl Nausea And Vomiting  . Oxycodone-Acetaminophen Itching   No current facility-administered medications on file prior to encounter.   Current Outpatient Prescriptions on File Prior to Encounter  Medication Sig Dispense Refill  . cholecalciferol (VITAMIN D) 1000 UNITS tablet Take 1,000 Units by mouth daily.        . Multiple Vitamin (MULTIVITAMIN) tablet Take 1 tablet by mouth daily.         History   Social History  . Marital Status: Married    Spouse Name: N/A    Number of Children: N/A  . Years of Education: N/A   Occupational History  . Radiation Therapist [employer]   Social History Main Topics  . Smoking status: Never Smoker   . Smokeless tobacco: Never Used  . Alcohol Use: No  . Drug Use: No  . Sexually Active: Yes    Birth Control/ Protection: Surgical   Other Topics Concern  . Not on file   Social History Narrative   Lives at home with husband and two children    Family History  Problem Relation Age of Onset  . Hyperlipidemia Father 60  . Hyperlipidemia Mother 65  . Hypertension Mother 59  . Hypertension Mother 95  . Heart disease Maternal Grandmother 80    Multiple MIs     ROS:  As stated in the HPI and negative for all other  systems.  Physical Exam: Blood pressure 130/91, pulse 81, temperature 97.9 F (36.6 C), temperature source Oral, resp. rate 15, SpO2 99.00%.  GENERAL:  Well appearing HEENT:  Pupils equal round and reactive, fundi not visualized, oral mucosa unremarkable NECK:  No jugular venous distention, waveform within normal limits, carotid upstroke brisk and symmetric, no bruits, no thyromegaly LYMPHATICS:  No cervical, inguinal adenopathy LUNGS:  Clear to auscultation bilaterally BACK:  No CVA tenderness CHEST:  Unremarkable HEART:  PMI not displaced or sustained,S1 and S2 within normal limits, no S3, no S4, no clicks, no rubs, no murmurs ABD:  Flat, positive bowel  sounds normal in frequency in pitch, no bruits, no rebound, no guarding, no midline pulsatile mass, no hepatomegaly, no splenomegaly EXT:  2 plus pulses throughout, no edema, no cyanosis no clubbing SKIN:  No rashes no nodules NEURO:  Cranial nerves II through XII grossly intact, motor grossly intact throughout PSYCH:  Cognitively intact, oriented to person place and time  Labs: Lab Results  Component Value Date   BUN 23 08/23/2011   Lab Results  Component Value Date   CREATININE 0.70 08/23/2011   Lab Results  Component Value Date   NA 140 08/23/2011   K 3.6 08/23/2011   CL 102 08/23/2011   CO2 27 08/23/2011   Lab Results  Component Value Date   CKTOTAL 185* 08/23/2011   CKMB 10.2* 08/23/2011   TROPONINI 2.14* 08/23/2011   Lab Results  Component Value Date   WBC 8.5 08/23/2011   HGB 14.1 08/23/2011   HCT 40.1 08/23/2011   MCV 87.6 08/23/2011   PLT 154 08/23/2011   Radiology:  CXR:  No active disease  EKG:  Sinus rhythm rate 81, axis and intervals WNL.  RSR' V1.  No acute ST T wave changes.  Idioventricular rhythm.  ASSESSMENT AND PLAN:    1)  NQWMI:  The patient has had a non-Q-wave myocardial infarction. She has minimal discomfort now and I will treat her with IV nitroglycerin, beta blockers, heparin and aspirin.  If she is not having any further discomfort she will have elective cardiac catheterization. Otherwise she will be taking more urgently to the cath lab. I have discussed this with her husband at length. I have reviewed all the risks including stroke heart attack death. He will be kept n.p.o. Tonight.  2)  VTach:  She had some idioventricular rhythm and is having some rare ventricular ectopy now. Will start a beta blocker and observe.  3)  Dyslipidemia:  This has been mild. I will start her on Lipitor 20 mg daily. To get a fasting lipid profile in the morning.  SignedRollene Rotunda 08/23/2011, 9:58 PM

## 2011-08-23 NOTE — ED Notes (Signed)
Pt states that she was grocery shopping and had sudden onset of "fullness" in her chest, numbness in both arms, weakness generalized.

## 2011-08-23 NOTE — ED Notes (Signed)
Repeat EKG done due to runs of PVC noted on monitor.

## 2011-08-24 ENCOUNTER — Encounter (HOSPITAL_COMMUNITY): Payer: Self-pay

## 2011-08-24 LAB — BASIC METABOLIC PANEL
BUN: 15 mg/dL (ref 6–23)
BUN: 15 mg/dL (ref 6–23)
CO2: 25 mEq/L (ref 19–32)
CO2: 24 mEq/L (ref 19–32)
Calcium: 8.9 mg/dL (ref 8.4–10.5)
Calcium: 9.1 mg/dL (ref 8.4–10.5)
Chloride: 111 mEq/L (ref 96–112)
Chloride: 108 mEq/L (ref 96–112)
Creatinine, Ser: 0.51 mg/dL (ref 0.50–1.10)
Creatinine, Ser: 0.53 mg/dL (ref 0.50–1.10)
GFR calc Af Amer: >90 mL/min (ref >90)
GFR calc Af Amer: >90 mL/min (ref >90)
GFR calc non Af Amer: >90 mL/min (ref >90)
GFR calc non Af Amer: >90 mL/min (ref >90)
Glucose, Bld: 105 mg/dL — ABNORMAL HIGH (ref 70–99)
Glucose, Bld: 95 mg/dL (ref 70–99)
Potassium: 3.2 mEq/L — ABNORMAL LOW (ref 3.5–5.1)
Potassium: 3.6 mEq/L (ref 3.5–5.1)
Sodium: 146 mEq/L — ABNORMAL HIGH (ref 135–145)
Sodium: 142 mEq/L (ref 135–145)

## 2011-08-24 LAB — CARDIAC PANEL(CRET KIN+CKTOT+MB+TROPI)
CK, MB: 115.5 ng/mL (ref 0.3–4.0)
CK, MB: 103.1 ng/mL (ref 0.3–4.0)
CK, MB: 61.5 ng/mL (ref 0.3–4.0)
Relative Index: 7.4 — ABNORMAL HIGH (ref 0.0–2.5)
Relative Index: 6.8 — ABNORMAL HIGH (ref 0.0–2.5)
Relative Index: 5.7 — ABNORMAL HIGH (ref 0.0–2.5)
Total CK: 1558 U/L — ABNORMAL HIGH (ref 7–177)
Total CK: 1522 U/L — ABNORMAL HIGH (ref 7–177)
Total CK: 1078 U/L — ABNORMAL HIGH (ref 7–177)
Troponin I: >25 ng/mL (ref <0.30)
Troponin I: >25 ng/mL (ref <0.30)
Troponin I: >25 ng/mL (ref <0.30)

## 2011-08-24 LAB — LIPID PANEL
Cholesterol: 172 mg/dL (ref 0–200)
HDL: 59 mg/dL (ref >39)
LDL Cholesterol: 97 mg/dL (ref 0–99)
Total CHOL/HDL Ratio: 2.9 RATIO
Triglycerides: 81 mg/dL (ref <150)
VLDL: 16 mg/dL (ref 0–40)

## 2011-08-24 LAB — CBC
HCT: 36.6 % (ref 36.0–46.0)
Hemoglobin: 12.7 g/dL (ref 12.0–15.0)
MCH: 30.5 pg (ref 26.0–34.0)
MCHC: 34.7 g/dL (ref 30.0–36.0)
MCV: 87.8 fL (ref 78.0–100.0)
Platelets: 138 10*3/uL — ABNORMAL LOW (ref 150–400)
RBC: 4.17 MIL/uL (ref 3.87–5.11)
RDW: 12 % (ref 11.5–15.5)
WBC: 6.8 10*3/uL (ref 4.0–10.5)

## 2011-08-24 LAB — PROTIME-INR
INR: 1.05 (ref 0.00–1.49)
Prothrombin Time: 13.9 seconds (ref 11.6–15.2)

## 2011-08-24 LAB — HEPARIN LEVEL (UNFRACTIONATED)
Heparin Unfractionated: 0.9 IU/mL — ABNORMAL HIGH (ref 0.30–0.70)
Heparin Unfractionated: 0.6 IU/mL (ref 0.30–0.70)
Heparin Unfractionated: 0.65 IU/mL (ref 0.30–0.70)

## 2011-08-24 LAB — MAGNESIUM: Magnesium: 2 mg/dL (ref 1.5–2.5)

## 2011-08-24 LAB — TSH: TSH: 1.318 u[IU]/mL (ref 0.350–4.500)

## 2011-08-24 LAB — APTT: aPTT: 113 seconds — ABNORMAL HIGH (ref 24–37)

## 2011-08-24 NOTE — Progress Notes (Signed)
  ANTICOAGULATION CONSULT NOTE - Follow Up Consult  Pharmacy Consult for Heparin Indication: NQWMI  Allergies  Allergen Reactions  . Hydromorphone Hcl Nausea And Vomiting  . Oxycodone-Acetaminophen Itching    Patient Measurements: Height: 5\' 8"  (172.7 cm) Weight: 139 lb 5.3 oz (63.2 kg) IBW/kg (Calculated) : 63.9  Heparin Dosing Weight: 63.2kg  Vital Signs: Temp: 98 F (36.7 C) (03/17 1600) Temp src: Oral (03/17 1600) BP: 98/61 mmHg (03/17 1900) Pulse Rate: 71  (03/17 1900)  Labs:  Basename 08/24/11 1747 08/24/11 1147 08/24/11 1144 08/24/11 0510 08/24/11 0010 08/24/11 0007 08/23/11 1755  HGB -- -- -- 12.7 -- -- 14.1  HCT -- -- -- 36.6 -- -- 40.1  PLT -- -- -- 138* -- -- 154  APTT -- -- -- -- 113* -- 30  LABPROT -- -- -- -- 13.9 -- 12.7  INR -- -- -- -- 1.05 -- 0.93  HEPARINUNFRC 0.65 0.60 -- -- 0.90* -- --  CREATININE -- -- -- 0.53 0.51 -- 0.70  CKTOTAL -- -- 1078* 1522* -- 1558* --  CKMB -- -- 61.5* 103.1* -- 115.5* --  TROPONINI -- -- >25.00* >25.00* -- >25.00* --   Estimated Creatinine Clearance: 85.8 ml/min (by C-G formula based on Cr of 0.53).   Medications:  Heparin @ 850 units/hr.    Assessment: 50 yoF on IV heparin for NQWMI.  Confirmatory heparin level therapeutic at 0.65.  Goal of Therapy:  Heparin level 0.3-0.7 units/ml   Plan:  -Continue heparin at current rate.      Benny Lennert 08/24/2011,7:28 PM

## 2011-08-24 NOTE — Progress Notes (Addendum)
ANTICOAGULATION CONSULT NOTE - Initial Consult  Pharmacy Consult for heparin Indication: NQWMI  Allergies  Allergen Reactions  . Hydromorphone Hcl Nausea And Vomiting  . Oxycodone-Acetaminophen Itching    Patient Measurements: Height: 5\' 8"  (172.7 cm) Weight: 132 lb 4.4 oz (60 kg) (Copied from 04/2011 notes, must be verified) IBW/kg (Calculated) : 63.9   Vital Signs: Temp: 98.7 F (37.1 C) (03/17 0000) Temp src: Oral (03/17 0000) BP: 113/75 mmHg (03/17 0019) Pulse Rate: 70  (03/17 0019)  Labs:  Basename 08/23/11 1755  HGB 14.1  HCT 40.1  PLT 154  APTT 30  LABPROT 12.7  INR 0.93  HEPARINUNFRC --  CREATININE 0.70  CKTOTAL 185*  CKMB 10.2*  TROPONINI 2.14*   Estimated Creatinine Clearance: 81.5 ml/min (by C-G formula based on Cr of 0.7).  Medical History: Past Medical History  Diagnosis Date  . Retinal vein occlusion     Right (July 2008)    Medications:  Prescriptions prior to admission  Medication Sig Dispense Refill  . aspirin 81 MG tablet Take 81 mg by mouth daily.      . cetirizine-pseudoephedrine (ZYRTEC-D) 5-120 MG per tablet Take 1 tablet by mouth 2 (two) times daily. Patient took this medication for her allergies.      . cholecalciferol (VITAMIN D) 1000 UNITS tablet Take 1,000 Units by mouth daily.        . Multiple Vitamin (MULTIVITAMIN) tablet Take 1 tablet by mouth daily.         Scheduled:    . aspirin  243 mg Oral Once  . aspirin EC  81 mg Oral Daily  . atorvastatin  20 mg Oral q1800  . heparin  4,000 Units Intravenous Once  . metoprolol tartrate  25 mg Oral BID  .  morphine injection  2 mg Intravenous Once  . nitroGLYCERIN  0.4 mg Sublingual Once  . nitroGLYCERIN      . ondansetron (ZOFRAN) IV  4 mg Intravenous Once  . sodium chloride  1,000 mL Intravenous Once  . sodium chloride  3 mL Intravenous Q12H    Assessment: 51yo female c/o CP, found to have had non-Q wave MI, to continue heparin begun in ED.  Goal of Therapy:  Heparin  level 0.3-0.7 units/ml   Plan:  Pt rec'd heparin bolus of 4000 units followed by gtt at 1000 units/hr as ordered by EDMD.  Will continue and adjust per heparin levels; monitor CBC.  Colleen Can PharmD BCPS 08/24/2011,12:53 AM   Addendum: First heparin level 0.9; will decrease gtt by ~2 units/kg/hr to 850 units/hr and check level with final CE. Vernard Gambles, PharmD, BCPS 08/24/2011 1:57 AM

## 2011-08-24 NOTE — Progress Notes (Signed)
  ANTICOAGULATION CONSULT NOTE - Follow Up Consult  Pharmacy Consult for Heparin Indication: NQWMI  Allergies  Allergen Reactions  . Hydromorphone Hcl Nausea And Vomiting  . Oxycodone-Acetaminophen Itching    Patient Measurements: Height: 5\' 8"  (172.7 cm) Weight: 139 lb 5.3 oz (63.2 kg) IBW/kg (Calculated) : 63.9  Heparin Dosing Weight: 63.2kg  Vital Signs: Temp: 98.1 F (36.7 C) (03/17 1200) Temp src: Oral (03/17 1200) BP: 96/54 mmHg (03/17 1200) Pulse Rate: 69  (03/17 1200)  Labs:  Basename 08/24/11 1147 08/24/11 1144 08/24/11 0510 08/24/11 0010 08/24/11 0007 08/23/11 1755  HGB -- -- 12.7 -- -- 14.1  HCT -- -- 36.6 -- -- 40.1  PLT -- -- 138* -- -- 154  APTT -- -- -- 113* -- 30  LABPROT -- -- -- 13.9 -- 12.7  INR -- -- -- 1.05 -- 0.93  HEPARINUNFRC 0.60 -- -- 0.90* -- --  CREATININE -- -- 0.53 0.51 -- 0.70  CKTOTAL -- 1078* 1522* -- 1558* --  CKMB -- 61.5* 103.1* -- 115.5* --  TROPONINI -- >25.00* >25.00* -- >25.00* --   Estimated Creatinine Clearance: 85.8 ml/min (by C-G formula based on Cr of 0.53).   Medications:  Heparin @ 850 units/hr.    Assessment: 50 yoF on IV heparin for NQWMI.  Heparin level therapeutic @ 0.60 after rate decreased this morning.  Hgb, plts low, stable.  No bleeding noted.    Goal of Therapy:  Heparin level 0.3-0.7 units/ml   Plan:  1.  Continue heparin at current rate.   2.  F/u confirmation heparin level @ 1800.    3.  Daily heparin levels, CBC.    Keiondra Brookover E 08/24/2011,1:08 PM

## 2011-08-24 NOTE — Progress Notes (Signed)
Patient ID: SYMPHANIE CEDERBERG, female   DOB: May 05, 1961, 51 y.o.   MRN: [id number]    @ Subjective:  Denies SSCP, palpitations or Dyspnea   Objective:  Filed Vitals:   08/24/11 0300 08/24/11 0400 08/24/11 0500 08/24/11 0700  BP: 97/66 102/64 97/63 100/64  Pulse: 51 54 72 61  Temp:  98.6 F (37 C)    TempSrc:  Oral    Resp: 14 13 16 12   Height:      Weight:   63.2 kg (139 lb 5.3 oz)   SpO2: 98% 98% 98% 99%    Intake/Output from previous day:  Intake/Output Summary (Last 24 hours) at 08/24/11 0751 Last data filed at 08/24/11 0500  Gross per 24 hour  Intake 1306.5 ml  Output   1250 ml  Net   56.5 ml    Physical Exam: Affect appropriate Healthy:  appears stated age HEENT: normal Neck supple with no adenopathy JVP normal no bruits no thyromegaly Lungs clear with no wheezing and good diaphragmatic motion Heart:  S1/S2 no murmur, no rub, gallop or click PMI normal Abdomen: benighn, BS positve, no tenderness, no AAA no bruit.  No HSM or HJR Distal pulses intact with no bruits No edema Neuro non-focal Skin warm and dry No muscular weakness   Lab Results: Basic Metabolic Panel:  Basename 08/24/11 0010 08/23/11 1755  NA 146* 140  K 3.2* 3.6  CL 111 102  CO2 25 27  GLUCOSE 105* 115*  BUN 15 23  CREATININE 0.51 0.70  CALCIUM 8.9 9.9  MG 2.0 --  PHOS -- --   Liver Function Tests: No results found for this basename: AST:2,ALT:2,ALKPHOS:2,BILITOT:2,PROT:2,ALBUMIN:2 in the last 72 hours No results found for this basename: LIPASE:2,AMYLASE:2 in the last 72 hours CBC:  Basename 08/24/11 0510 08/23/11 1755  WBC 6.8 8.5  NEUTROABS -- --  HGB 12.7 14.1  HCT 36.6 40.1  MCV 87.8 87.6  PLT 138* 154   Cardiac Enzymes:  Basename 08/24/11 0510 08/24/11 0007 08/23/11 1755  CKTOTAL 1522* 1558* 185*  CKMB 103.1* 115.5* 10.2*  CKMBINDEX -- -- --  TROPONINI >25.00* >25.00* 2.14*    Imaging: Dg Chest 2 View  08/23/2011  *RADIOLOGY REPORT*  Clinical Data: Chest  pain  CHEST - 2 VIEW  Comparison: None.  Findings: Cardiomediastinal silhouette is unremarkable.  No acute infiltrate or pleural effusion.  No pulmonary edema.  Bony thorax is unremarkable.  IMPRESSION: No active disease.  Original Report Authenticated By: Natasha Mead, M.D.    Cardiac Studies:  ECG: SR rate 56 normal ECG   Telemetry: SR no VT  Echo:   Medications:     . aspirin  243 mg Oral Once  . aspirin EC  81 mg Oral Daily  . atorvastatin  20 mg Oral q1800  . heparin  4,000 Units Intravenous Once  . metoprolol tartrate  25 mg Oral BID  .  morphine injection  2 mg Intravenous Once  . nitroGLYCERIN  0.4 mg Sublingual Once  . nitroGLYCERIN      . ondansetron (ZOFRAN) IV  4 mg Intravenous Once  . sodium chloride  1,000 mL Intravenous Once  . sodium chloride  3 mL Intravenous Q12H       . heparin 850 Units/hr (08/24/11 0156)  . nitroGLYCERIN 30 mcg/min (08/24/11 0500)    Assessment/Plan:  SEMI:  Fairly large CPK bump.  ECG is normal suggesting possible circumflex disease.  No pain or VT this am Plan per [facility] is cath in am.  Patient has seen video and understands risks and benefits.  Continue ASA/heparin and beta blocker Chol:  Continue statin VT:  Idioventricular hopefully implies reperfusion.  Related to ischemia.  Continue beta blocker Non overnight  Charlton Haws 08/24/2011, 7:51 AM

## 2011-08-25 ENCOUNTER — Encounter (HOSPITAL_COMMUNITY)
Admission: EM | Disposition: A | Discharge status: Home / Self Care | Payer: Self-pay | Source: Home / Self Care | Attending: Cardiology

## 2011-08-25 DIAGNOSIS — I214 Non-ST elevation (NSTEMI) myocardial infarction: Secondary | ICD-10-CM

## 2011-08-25 HISTORY — PX: LEFT HEART CATH: SHX5478

## 2011-08-25 LAB — GLUCOSE, CAPILLARY: Glucose-Capillary: 112 mg/dL — ABNORMAL HIGH (ref 70–99)

## 2011-08-25 LAB — HEPARIN LEVEL (UNFRACTIONATED): Heparin Unfractionated: 0.44 IU/mL (ref 0.30–0.70)

## 2011-08-25 SURGERY — LEFT HEART CATH
Anesthesia: Moderate Sedation

## 2011-08-25 MED ORDER — VITAMIN D3 25 MCG (1000 UNIT) PO TABS
1000.0000 [IU] | ORAL_TABLET | Freq: Every day | ORAL | Status: DC
  Administered 2011-08-25 – 2011-08-26 (×2): 1000 [IU] via ORAL
  Filled 2011-08-25 (×3): qty 1

## 2011-08-25 MED ORDER — SODIUM CHLORIDE 0.9 % IJ SOLN: 3.0000 mL | INTRAMUSCULAR | Status: DC | PRN

## 2011-08-25 MED ORDER — MIDAZOLAM HCL 2 MG/2ML IJ SOLN
INTRAMUSCULAR | Status: AC
  Filled 2011-08-25: qty 2

## 2011-08-25 MED ORDER — ONE-DAILY MULTI VITAMINS PO TABS: 1.0000 | ORAL_TABLET | Freq: Every day | ORAL | Status: DC

## 2011-08-25 MED ORDER — FENTANYL CITRATE 0.05 MG/ML IJ SOLN
INTRAMUSCULAR | Status: AC
  Filled 2011-08-25: qty 2

## 2011-08-25 MED ORDER — ASPIRIN 81 MG PO CHEW
324.0000 mg | CHEWABLE_TABLET | ORAL | Status: AC
  Administered 2011-08-25: 324 mg via ORAL
  Filled 2011-08-25: qty 4

## 2011-08-25 MED ORDER — SODIUM CHLORIDE 0.9 % IJ SOLN: 3.0000 mL | Freq: Two times a day (BID) | INTRAMUSCULAR | Status: DC

## 2011-08-25 MED ORDER — ADULT MULTIVITAMIN W/MINERALS CH
1.0000 | ORAL_TABLET | Freq: Every day | ORAL | Status: DC
  Administered 2011-08-25 – 2011-08-26 (×2): 1 via ORAL
  Filled 2011-08-25 (×2): qty 1

## 2011-08-25 MED ORDER — ONDANSETRON HCL 4 MG/2ML IJ SOLN: 4.0000 mg | Freq: Four times a day (QID) | INTRAMUSCULAR | Status: DC | PRN

## 2011-08-25 MED ORDER — SODIUM CHLORIDE 0.9 % IV SOLN: 250.0000 mL | INTRAVENOUS | Status: DC

## 2011-08-25 MED ORDER — SODIUM CHLORIDE 0.9 % IV SOLN: 250.0000 mL | INTRAVENOUS | Status: DC | PRN

## 2011-08-25 MED ORDER — SODIUM CHLORIDE 0.9 % IV SOLN
1.0000 mL/kg/h | INTRAVENOUS | Status: AC
  Administered 2011-08-25: 1 mL/kg/h via INTRAVENOUS

## 2011-08-25 MED ORDER — ASPIRIN 81 MG PO CHEW: 324.0000 mg | CHEWABLE_TABLET | ORAL | Status: DC

## 2011-08-25 MED ORDER — ASPIRIN 81 MG PO TABS: 81.0000 mg | ORAL_TABLET | Freq: Every day | ORAL | Status: DC

## 2011-08-25 MED ORDER — LIDOCAINE HCL (PF) 1 % IJ SOLN
INTRAMUSCULAR | Status: AC
  Filled 2011-08-25: qty 30

## 2011-08-25 MED ORDER — HEPARIN SODIUM (PORCINE) 1000 UNIT/ML IJ SOLN
INTRAMUSCULAR | Status: AC
  Filled 2011-08-25: qty 1

## 2011-08-25 MED ORDER — NITROGLYCERIN 0.2 MG/ML ON CALL CATH LAB
INTRAVENOUS | Status: AC
  Filled 2011-08-25: qty 1

## 2011-08-25 MED ORDER — VERAPAMIL HCL 2.5 MG/ML IV SOLN
INTRAVENOUS | Status: AC
  Filled 2011-08-25: qty 2

## 2011-08-25 MED ORDER — ASPIRIN EC 81 MG PO TBEC
81.0000 mg | DELAYED_RELEASE_TABLET | Freq: Every day | ORAL | Status: DC
  Administered 2011-08-26: 81 mg via ORAL
  Filled 2011-08-25 (×2): qty 1

## 2011-08-25 MED ORDER — HEPARIN (PORCINE) IN NACL 2-0.9 UNIT/ML-% IJ SOLN
INTRAMUSCULAR | Status: AC
  Filled 2011-08-25: qty 2000

## 2011-08-25 MED ORDER — ACETAMINOPHEN 325 MG PO TABS: 650.0000 mg | ORAL_TABLET | ORAL | Status: DC | PRN

## 2011-08-25 NOTE — CV Procedure (Signed)
   Cardiac Catheterization Procedure Note  Name: Kaylee Banks MRN: [id number] DOB: March 15, 1961  Procedure: Left Heart Cath, Selective Coronary Angiography, LV angiography  Indication: Non-ST elevation MI   Procedural Details: The right wrist was prepped, draped, and anesthetized with 1% lidocaine. Using the modified Seldinger technique, a 5 French sheath was introduced into the right radial artery. 3 mg of verapamil was administered through the sheath, weight-based unfractionated heparin was administered intravenously. Standard Judkins catheters were used for selective coronary angiography and left ventriculography. Catheter exchanges were performed over an exchange length guidewire. There were no immediate procedural complications. A TR band was used for radial hemostasis at the completion of the procedure.  The patient was transferred to the post catheterization recovery area for further monitoring.  Procedural Findings: Hemodynamics: AO 9059 LV 9010  Coronary angiography: Coronary dominance: right  Left mainstem: widely patent  Left anterior descending (LAD): The LAD is widely patent to the apex of the heart. The first diagonal a slightly patent. The LAD has a separate origin from the circumflex. There is no significant stenosis present.  Left circumflex (LCx): The circumflex is patent. The vessel supplies a first OM, second OM, and third OM. The first obtuse marginal tapers in the midportion of the vessel and there is diffuse narrowing throughout 2 subbranches of the first OM. The second OM is widely patent. The third OM shows some distal vessel tapering. The appearance of the first OM is suggestive of intramural hematoma.  Right coronary artery (RCA): Large, dominant vessel. Widely patent throughout there   Left ventriculography:  there is a focal wall motion abnormality in the anterolateral wall, the overall left ventricular ejection fraction is preserved. The ejection fraction  is estimated at 60%.   Final Conclusions:   1. Diffuse stenosis of the first obtuse marginal branch with angiographic appearance suggestive of intramural hematoma. 2. Wide patency of the LAD and right coronary arteries 3. Focal left ventricular contraction abnormality with preserved overall LV function   Recommendations: medical therapy.   Tonny Bollman 08/25/2011, 10:05 AM

## 2011-08-25 NOTE — Progress Notes (Signed)
   SUBJECTIVE:  No chest pain.  No SOB.   PHYSICAL EXAM Filed Vitals:   08/25/11 0402 08/25/11 0500 08/25/11 0600 08/25/11 0619  BP:  99/58 96/57   Pulse:  88 84   Temp: 99.7 F (37.6 C)     TempSrc: Oral     Resp:  19 19   Height:      Weight:  62.6 kg (138 lb 0.1 oz)  62.6 kg (138 lb 0.1 oz)  SpO2:  94% 94%    General:  No distress Lungs:  Clear Heart:  RRR, no rub, no murmur Abdomen:  Positive bowel sounds, no rebound no guarding Extremities:  No edema  LABS: Lab Results  Component Value Date   CKTOTAL 1078* 08/24/2011   CKMB 61.5* 08/24/2011   TROPONINI >25.00* 08/24/2011   Results for orders placed during the hospital encounter of 08/23/11 (from the past 24 hour(s))  CARDIAC PANEL(CRET KIN+CKTOT+MB+TROPI)     Status: Abnormal   Collection Time   08/24/11 11:44 AM      Component Value Range   Total CK 1078 (*) 7 - 177 (U/L)   CK, MB 61.5 (*) 0.3 - 4.0 (ng/mL)   Troponin I >25.00 (*) <0.30 (ng/mL)   Relative Index 5.7 (*) 0.0 - 2.5   HEPARIN LEVEL (UNFRACTIONATED)     Status: Normal   Collection Time   08/24/11 11:47 AM      Component Value Range   Heparin Unfractionated 0.60  0.30 - 0.70 (IU/mL)  HEPARIN LEVEL (UNFRACTIONATED)     Status: Normal   Collection Time   08/24/11  5:47 PM      Component Value Range   Heparin Unfractionated 0.65  0.30 - 0.70 (IU/mL)  HEPARIN LEVEL (UNFRACTIONATED)     Status: Normal   Collection Time   08/25/11  5:00 AM      Component Value Range   Heparin Unfractionated 0.44  0.30 - 0.70 (IU/mL)    Intake/Output Summary (Last 24 hours) at 08/25/11 0737 Last data filed at 08/25/11 0600  Gross per 24 hour  Intake   1425 ml  Output   1450 ml  Net    -25 ml    EKG:  NSR rate 82 RSR' V1 and V2, no acute ST T wave changes. 08/25/2011  ASSESSMENT AND PLAN:  1) Non-STEMI (non-ST elevated myocardial infarction):  Cath today.  She has been pain free since admission.  Wean down IV NTG as BP is running low.  One dose of beta blocker held  last night.  Brief bedside echo this am without effusion.  Overall EF was OK but images are suboptimal.  I will order an echocardiogram.  2) Dyslipidemia:  On statin  3) Ventricular ectopy:  NSR, no ectopy.  Idoventricular rhythm possibly referfusion.  Samone Guhl 08/25/2011 7:37 AM   

## 2011-08-25 NOTE — Progress Notes (Signed)
UR Completed. Simmons, Zaid Tomes F [phone]  

## 2011-08-25 NOTE — Interval H&P Note (Signed)
History and Physical Interval Note:  08/25/2011 9:11 AM  Kaylee Banks  has presented today for surgery, with the diagnosis of SEMI  The various methods of treatment have been discussed with the patient and family. After consideration of risks, benefits and other options for treatment, the patient has consented to  Procedure(s) (LRB): LEFT HEART CATH (N/A) and possible PCI as a surgical intervention .  The patients' history has been reviewed, patient examined, no change in status, stable for surgery.  I have reviewed the patients' chart and labs.  Questions were answered to the patient's satisfaction.  EKG's reviewed and there have been no significant ST-T changes. However, there is interval change with prominent R waves in V1-V2 suggestive of posterior infarct.  Tonny Bollman 08/25/2011 9:11 AM

## 2011-08-25 NOTE — H&P (View-Only) (Signed)
   SUBJECTIVE:  No chest pain.  No SOB.   PHYSICAL EXAM Filed Vitals:   08/25/11 0402 08/25/11 0500 08/25/11 0600 08/25/11 0619  BP:  99/58 96/57   Pulse:  88 84   Temp: 99.7 F (37.6 C)     TempSrc: Oral     Resp:  19 19   Height:      Weight:  62.6 kg (138 lb 0.1 oz)  62.6 kg (138 lb 0.1 oz)  SpO2:  94% 94%    General:  No distress Lungs:  Clear Heart:  RRR, no rub, no murmur Abdomen:  Positive bowel sounds, no rebound no guarding Extremities:  No edema  LABS: Lab Results  Component Value Date   CKTOTAL 1078* 08/24/2011   CKMB 61.5* 08/24/2011   TROPONINI >25.00* 08/24/2011   Results for orders placed during the hospital encounter of 08/23/11 (from the past 24 hour(s))  CARDIAC PANEL(CRET KIN+CKTOT+MB+TROPI)     Status: Abnormal   Collection Time   08/24/11 11:44 AM      Component Value Range   Total CK 1078 (*) 7 - 177 (U/L)   CK, MB 61.5 (*) 0.3 - 4.0 (ng/mL)   Troponin I >25.00 (*) <0.30 (ng/mL)   Relative Index 5.7 (*) 0.0 - 2.5   HEPARIN LEVEL (UNFRACTIONATED)     Status: Normal   Collection Time   08/24/11 11:47 AM      Component Value Range   Heparin Unfractionated 0.60  0.30 - 0.70 (IU/mL)  HEPARIN LEVEL (UNFRACTIONATED)     Status: Normal   Collection Time   08/24/11  5:47 PM      Component Value Range   Heparin Unfractionated 0.65  0.30 - 0.70 (IU/mL)  HEPARIN LEVEL (UNFRACTIONATED)     Status: Normal   Collection Time   08/25/11  5:00 AM      Component Value Range   Heparin Unfractionated 0.44  0.30 - 0.70 (IU/mL)    Intake/Output Summary (Last 24 hours) at 08/25/11 0737 Last data filed at 08/25/11 0600  Gross per 24 hour  Intake   1425 ml  Output   1450 ml  Net    -25 ml    EKG:  NSR rate 82 RSR' V1 and V2, no acute ST T wave changes. 08/25/2011  ASSESSMENT AND PLAN:  1) Non-STEMI (non-ST elevated myocardial infarction):  Cath today.  She has been pain free since admission.  Wean down IV NTG as BP is running low.  One dose of beta blocker held  last night.  Brief bedside echo this am without effusion.  Overall EF was OK but images are suboptimal.  I will order an echocardiogram.  2) Dyslipidemia:  On statin  3) Ventricular ectopy:  NSR, no ectopy.  Idoventricular rhythm possibly referfusion.  Rollene Rotunda 08/25/2011 7:37 AM

## 2011-08-25 NOTE — Progress Notes (Signed)
TR BAND REMOVAL  LOCATION:  right radial  DEFLATED PER PROTOCOL:  yes  TIME BAND OFF / DRESSING APPLIED:   1330   SITE UPON ARRIVAL:   Level 0  SITE AFTER BAND REMOVAL:  Level 0  REVERSE ALLEN'S TEST:    positive  CIRCULATION SENSATION AND MOVEMENT:  Within Normal Limits  yes  COMMENTS:    

## 2011-08-26 ENCOUNTER — Encounter (HOSPITAL_COMMUNITY): Payer: Self-pay | Admitting: Nurse Practitioner

## 2011-08-26 DIAGNOSIS — I2510 Atherosclerotic heart disease of native coronary artery without angina pectoris: Secondary | ICD-10-CM | POA: Insufficient documentation

## 2011-08-26 DIAGNOSIS — H348192 Central retinal vein occlusion, unspecified eye, stable: Secondary | ICD-10-CM | POA: Insufficient documentation

## 2011-08-26 DIAGNOSIS — I369 Nonrheumatic tricuspid valve disorder, unspecified: Secondary | ICD-10-CM

## 2011-08-26 LAB — BASIC METABOLIC PANEL
BUN: 13 mg/dL (ref 6–23)
CO2: 25 mEq/L (ref 19–32)
Calcium: 9.1 mg/dL (ref 8.4–10.5)
Chloride: 108 mEq/L (ref 96–112)
Creatinine, Ser: 0.53 mg/dL (ref 0.50–1.10)
GFR calc Af Amer: >90 mL/min (ref >90)
GFR calc non Af Amer: >90 mL/min (ref >90)
Glucose, Bld: 98 mg/dL (ref 70–99)
Potassium: 3.7 mEq/L (ref 3.5–5.1)
Sodium: 142 mEq/L (ref 135–145)

## 2011-08-26 LAB — CBC
HCT: 35.3 % — ABNORMAL LOW (ref 36.0–46.0)
Hemoglobin: 12.2 g/dL (ref 12.0–15.0)
MCH: 30.7 pg (ref 26.0–34.0)
MCHC: 34.6 g/dL (ref 30.0–36.0)
MCV: 88.7 fL (ref 78.0–100.0)
Platelets: 123 10*3/uL — ABNORMAL LOW (ref 150–400)
RBC: 3.98 MIL/uL (ref 3.87–5.11)
RDW: 11.9 % (ref 11.5–15.5)
WBC: 6.1 10*3/uL (ref 4.0–10.5)

## 2011-08-26 MED ORDER — CLOPIDOGREL BISULFATE 75 MG PO TABS: 75.0000 mg | ORAL_TABLET | Freq: Every day | ORAL | Status: DC

## 2011-08-26 MED ORDER — NITROGLYCERIN 0.4 MG SL SUBL: 0.4000 mg | SUBLINGUAL_TABLET | SUBLINGUAL | Status: DC | PRN

## 2011-08-26 MED ORDER — METOPROLOL SUCCINATE ER 25 MG PO TB24: 25.0000 mg | ORAL_TABLET | Freq: Every day | ORAL | Status: DC

## 2011-08-26 MED ORDER — ATORVASTATIN CALCIUM 80 MG PO TABS: 80.0000 mg | ORAL_TABLET | Freq: Every day | ORAL | Status: DC

## 2011-08-26 NOTE — Discharge Instructions (Signed)
***PLEASE REMEMBER TO BRING ALL OF YOUR MEDICATIONS TO EACH OF YOUR FOLLOW-UP OFFICE VISITS.  NO HEAVY LIFTING X 2 WEEKS. NO SEXUAL ACTIVITY X 2 WEEKS. NO DRIVING X 1 WEEK. NO SOAKING BATHS, HOT TUBS, POOLS, ETC., X 7 DAYS. Acute Coronary Syndrome Acute coronary syndrome (ACS) is an urgent problem in which the blood and oxygen supply to the heart is critically deficient. ACS requires hospitalization because one or more coronary arteries may be blocked. ACS represents a range of conditions including:  Previous angina that is now unstable, lasts longer, happens at rest, or is more intense.   A heart attack, with heart muscle cell injury and death.  There are three vital coronary arteries that supply the heart muscle with blood and oxygen so that it can pump blood effectively. If blockages to these arteries develop, blood flow to the heart muscle is reduced. If the heart does not get enough blood, angina may occur as the first warning sign. SYMPTOMS   The most common signs of angina include:   Tightness or squeezing in the chest.   Feeling of heaviness on the chest.   Discomfort in the arms, neck, or jaw.   Shortness of breath and nausea.   Cold, wet skin.   Angina is usually brought on by physical effort or excitement which increase the oxygen needs of the heart. These states increase the blood flow needs of the heart beyond what can be delivered.  TREATMENT   Medicines to help discomfort may include nitroglycerin (nitro) in the form of tablets or a spray for rapid relief, or longer-acting forms such as cream, patches, or capsules. (Be aware that there are many side effects and possible interactions with other drugs).   Other medicines may be used to help the heart pump better.   Procedures to open blocked arteries including angioplasty or stent placement to keep the arteries open.   Open heart surgery may be needed when there are many blockages or they are in critical locations  that are best treated with surgery.  HOME CARE INSTRUCTIONS   Avoid smoking.   Take one baby or adult aspirin daily, if your caregiver advises. This helps reduce the risk of a heart attack.   It is very important that you follow the angina treatment prescribed by your caregiver. Make arrangements for proper follow-up care.   Eat a heart healthy diet with salt and fat restrictions as advised.   Regular exercise is good for you as long as it does not cause discomfort. Do not begin any new type of exercise until you check with your caregiver.   If you are overweight, you should lose weight.   Try to maintain normal blood lipid levels.   Keep your blood pressure under control as recommended by your caregiver.   You should tell your caregiver right away about any increase in the severity or frequency of your chest discomfort or angina attacks. When you have angina, you should stop what you are doing and sit down. This may bring relief in 3 to 5 minutes. If your caregiver has prescribed nitro, take it as directed.   If your caregiver has given you a follow-up appointment, it is very important to keep that appointment. Not keeping the appointment could result in a chronic or permanent injury, pain, and disability. If there is any problem keeping the appointment, you must call back to this facility for assistance.  SEEK IMMEDIATE MEDICAL CARE IF:   You develop nausea, vomiting, or  shortness of breath.   You feel faint, lightheaded, or pass out.   Your chest discomfort gets worse.   You are sweating or experience sudden profound fatigue.   You do not get relief of your chest pain after 3 doses of nitro.   Your discomfort lasts longer than 15 minutes.  MAKE SURE YOU:   Understand these instructions.   Will watch your condition.   Will get help right away if you are not doing well or get worse.  Document Released: 05/26/2005 Document Revised: 05/15/2011 Document Reviewed:  12/28/2007 Exit[employer] Patient Information 2012 ExitCare, LLCMyocardial Infarction A myocardial infarction (MI) is damage to the heart that is not reversible. It is also called a heart attack. An MI usually occurs when a heart (coronary) artery becomes blocked or narrowed. This cuts off the blood supply to the heart. When one or more of the heart (coronary) arteries becomes blocked, that area of the heart begins to die. This causes pain felt during an MI.  If you think you might be having an MI, call your local emergency services immediately (911 in [place] It is recommended that you take a 162 mg non-enteric coated aspirin if you do not have an aspirin allergy. Do not drive yourself to the hospital or wait to see if your symptoms go away. The sooner MI is treated, the greater the amount of heart muscle saved. Time is muscle. It can save your life. CAUSES  An MI can occur from:  A gradual buildup of a fatty substance called plaque. When plaque builds up in the arteries, this condition is called atherosclerosis. This buildup can block or reduce the blood supply to the heart artery(s).   A sudden plaque rupture within a heart artery that causes a blood clot (thrombus). A blood clot can block the heart artery which does not allow blood flow to the heart.   A severe tightening (spasm) of the heart artery. This is a less common cause of a heart attack. When a heart artery spasms, it cuts off blood flow through the artery. Spasms can occur in heart arteries that do not have atherosclerosis.  RISK FACTORS People at risk for an MI usually have one or more risk factors, such as:  High blood pressure.   High cholesterol.   Smoking.   Gender. Men have a higher heart attack risk.   Overweight/obesity.   Age.   Family history.   Lack of exercise.   Diabetes.   Stress.   Excessive alcohol use.   Street drug use (cocaine and methamphetamines).  SYMPTOMS  MI symptoms can vary, such as:  In  both men and women, MI symptoms can include the following:   Chest pain. The chest pain may feel like a crushing, squeezing, or "pressure" type feeling. MI pain can be "referred," meaning pain can be caused in one part of the body but felt in another part of the body. Referred MI pain may occur in the left arm, neck, or jaw. Pain may even be felt in the right arm.   Shortness of breath (dyspnea).   Heartburn or indigestion with or without vomiting, shortness of breath, or sweating (diaphoresis).   Sudden, cold sweats.   Sudden lightheadedness.   Upper back pain.   Women can have unique MI symptoms, such as:   Unexplained feelings of nervousness or anxiety.   Discomfort between the shoulder blades (scapula) or upper back.   Tingling in the hands and arms.   In elderly people (  regardless of gender), MI symptoms can be subtle, such as:   Sweating (diaphoresis).   Shortness of breath (dyspnea).   General tiredness (fatigue) or not feeling well (malaise).  DIAGNOSIS  Diagnosis of an MI involves several tests such as:  An assessment of your vital signs such as heart rhythm, blood pressure, respiratory rate, and oxygen level.   An EKG (ECG) to look at the electrical activity of your heart.   Blood tests called cardiac markers are drawn at scheduled times to measure proteins or enzymes released by the damaged heart muscle.   A chest X-ray.   An echocardiogram to evaluate heart motion and blood flow.   Coronary angiography (cardiac catheterization). This is a diagnostic procedure to look at the heart arteries.  TREATMENT  Acute Intervention. For an MI, the national standard in the [place] is to have an acute intervention in under 90 minutes from the time you get to the hospital. An acute intervention is a special procedure to open up the heart arteries. It is done in a treatment room called a "catheterization lab" (cath lab). Some hospitals do no have a cath lab. If you are  having an MI and the hospital does not have a cath lab, the standard is to transport you to a hospital that has one. In the cath lab, acute intervention includes:  Angioplasty. An angioplasty involves inserting a thin, flexible tube (catheter) into an artery in either your groin or wrist. The catheter is threaded to the heart arteries. A balloon at the end of the catheter is inflated to open a narrowed or blocked heart artery. During an angioplasty procedure, a small mesh tube (stent) may be used to keep the heart artery open. Depending on your condition and health history, one of two types of stents may be placed:   Drug-eluting stent (DES). A DES is coated with a medicine to prevent scar tissue from growing over the stent. With drug-eluting stents, blood thinning medicine will need to be taken for up to a year.   Bare metal stent. This type of stent has no special coating to keep tissue from growing over it. This type of stent is used if you cannot take blood thinning medicine for a prolonged time or you need surgery in the near future. After a bare metal stent is placed, blood thinning medicine will need to be taken for about a month.   If you are taking blood thinning medicine (anti-platelet therapy) after stent placement, do not stop taking it unless your caregiver says it is okay to do so. Make sure you understand how long you need to take the medicine.  Surgical Intervention  If an acute intervention is not successful, surgery may be needed:   Open heart surgery (coronary artery bypass graft, CABG). CABG takes a vein (saphenous vein) from your leg. The vein is then attached to the blocked heart artery which bypasses the blockage. This then allows blood flow to the heart muscle.  Additional Interventions  A "clot buster" medicine (thrombolytic) may be given. This medicine can help break up a clot in the heart artery. This medicine may be given if a person cannot get to a cath lab right away.    Intra-aortic balloon pump (IABP). If you have suffered a very severe MI and are too unstable to go to the cath lab or to surgery, an IABP may be used. This is a temporary mechanical device used to increase blood flow to the heart and reduce  the workload of the heart until you are stable enough to go to the cath lab or surgery.  HOME CARE INSTRUCTIONS After an MI, you may need the following:  Medication. Take medication as directed by your caregiver. Medications after an MI may:   Keep your blood from clotting easily (blood thinners).   Control your blood pressure.   Help lower your cholesterol.   Control abnormal heart rhythms.   Lifestyle changes. Under the guidance of your caregiver, lifestyle changes include:   Quitting smoking, if you smoke. Your caregiver can help you quit.   Being physically active.   Maintaining a healthy weight.   Eating a heart healthy diet. A dietician can help you learn healthy eating options.   Managing diabetes.   Reducing stress.   Limiting alcohol intake.  SEEK IMMEDIATE MEDICAL CARE IF:   You have severe chest pain, especially if the pain is crushing or pressure-like and spreads to the arms, back, neck, or jaw. This is an emergency. Do not wait to see if the pain will go away. Get medical help at once. Call your local emergency services (911 in the [place] Do not drive yourself to the hospital.   You have shortness of breath during rest, sleep, or with activity.   You have sudden sweating or clammy skin.   You feel sick to your stomach (nauseous) and throw up (vomit).   You suddenly become lightheaded or dizzy.   You feel your heart beating rapidly or you notice "skipped" beats.  MAKE SURE YOU:   Understand these instructions.   Will watch your condition.   Will get help right away if you are not doing well or get worse.  Document Released: 05/26/2005 Document Revised: 05/15/2011 Document Reviewed: 10/23/2010 [facility] Patient  Information 2012 [place], [place].Marland Kitchen

## 2011-08-26 NOTE — Progress Notes (Signed)
*  PRELIMINARY RESULTS* Echocardiogram 2D Echocardiogram has been performed.  Kaylee Banks [facility] 08/26/2011, 9:14 AM

## 2011-08-26 NOTE — Progress Notes (Signed)
MD on call notified of pt not being on Nitro drip and he stated that as long as the pt was pain free and her bp were within normal range it is okay to keep her off the drip. Sanda Linger

## 2011-08-26 NOTE — Progress Notes (Signed)
CARDIAC REHAB PHASE I   PRE:  Rate/Rhythm: 73 SR    BP: sitting 106/60    SaO2:   MODE:  Ambulation: 760 ft   POST:  Rate/Rhythm: 79 SR, no ectopy    BP: sitting 110/70     SaO2:   Tolerated very well. No c/o. No CP or ectopy. Ed completed. Requests her name be sent to [facility]. Will send. [phone]  Harriet Masson CES, ACSM

## 2011-08-26 NOTE — Discharge Summary (Signed)
Patient ID: Kaylee Banks,  MRN: [id number], DOB/AGE: 1961/06/08 51 y.o.  Admit date: 08/23/2011 Discharge date: 08/26/2011  Primary Care Provider: Arvella Merles, MD Primary Cardiologist: J. Charod Slawinski, MD  Discharge Diagnoses Principal Problem:  *Non-STEMI (non-ST elevated myocardial infarction) Active Problems:  Ventricular ectopy  Dyslipidemia  CAD (coronary artery disease)  *Cath 3.18.2013 - Nonobs dzs with evidence of intramural thrombus in distal OM1 - Med Rx  Allergies Allergies  Allergen Reactions  . Hydromorphone Hcl Nausea And Vomiting  . Oxycodone-Acetaminophen Itching   Procedures  Cardiac Catheterization 3.18.2013  Hemodynamics: AO 9059 LV 9010  Coronary angiography: Coronary dominance: right  Left mainstem: widely patent Left anterior descending (LAD): The LAD is widely patent to the apex of the heart. The first diagonal a slightly patent. The LAD has a separate origin from the circumflex. There is no significant stenosis present. Left circumflex (LCx): The circumflex is patent. The vessel supplies a first OM, second OM, and third OM. The first obtuse marginal tapers in the midportion of the vessel and there is diffuse narrowing throughout 2 subbranches of the first OM. The second OM is widely patent. The third OM shows some distal vessel tapering. The appearance of the first OM is suggestive of intramural hematoma. Right coronary artery (RCA): Large, dominant vessel. Widely patent throughout there   Left ventriculography:  there is a focal wall motion abnormality in the anterolateral wall, the overall left ventricular ejection fraction is preserved. The ejection fraction is estimated at 60%  Final Conclusions:   1. Diffuse stenosis of the first obtuse marginal branch with angiographic appearance suggestive of intramural hematoma. 2. Wide patency of the LAD and right coronary arteries 3. Focal left ventricular contraction abnormality with preserved  overall LV function  Recommendations: medical therapy.  ___________________________   2D Echocardiogram 3.19.2013 Study Conclusions  - Left ventricle: The cavity size was normal. Wall thickness   was normal. Systolic function was normal. The estimated   ejection fraction was in the range of 55% to 60%. Wall   motion was normal; there were no regional wall motion   abnormalities. Left ventricular diastolic function   parameters were normal. - Atrial septum: No defect or patent foramen ovale was   identified.  History of Present Illness  51 y/o female without prior cardiac history who was in her usoh until the afternoon of admission when she began to experience bilateral arm heavines and aching with chest pressure.  Symptoms persisted and she presented to the [place] ED where her EKG showed no acute ST/T changes but she was having runs of idioventricular rhythm and her cardiac markers were elevated (CK 185, MB 10.2, Ti 2.14).  She was subsequently transferred to [facility] and was placed on heparin and IV NTG and admitted for further evaluation.  Hospital Course  Patient further ruled in for myocardial infarction eventually peaking her CK at 1558, MB at 115.5, and troponin I at greater than 25.00. She had no further chest discomfort over the weekend. She underwent diagnostic catheterization on March 18 revealing nonobstructive disease with evidence of intramural hematoma in the first obtuse marginal. Medical therapy was recommended. Patient was transferred out to the floor and has been doing without recurrent symptoms or limitations. She has been maintained on aspirin, statin, and low-dose beta blocker therapy. A 2-D echocardiogram was performed today showing normal LV function. She'll be discharged home today in good condition.  Discharge Vitals Blood pressure 101/61, pulse 72, temperature 99.8 F (37.7 C), temperature source  Oral, resp. rate 18, height 5\' 8"  (1.727 m), weight 140 lb 11.2  oz (63.821 kg), SpO2 98.00%.  Filed Weights   08/25/11 0500 08/25/11 0619 08/26/11 0500  Weight: 138 lb 0.1 oz (62.6 kg) 138 lb 0.1 oz (62.6 kg) 140 lb 11.2 oz (63.821 kg)   Labs  CBC  Basename 08/26/11 0500 08/24/11 0510  WBC 6.1 6.8  NEUTROABS -- --  HGB 12.2 12.7  HCT 35.3* 36.6  MCV 88.7 87.8  PLT 123* 138*   Basic Metabolic Panel  Basename 08/26/11 0500 08/24/11 0510 08/24/11 0010  NA 142 142 --  K 3.7 3.6 --  CL 108 108 --  CO2 25 24 --  GLUCOSE 98 95 --  BUN 13 15 --  CREATININE 0.53 0.53 --  CALCIUM 9.1 9.1 --  MG -- -- 2.0  PHOS -- -- --   Cardiac Enzymes  Basename 08/24/11 1144 08/24/11 0510 08/24/11 0007  CKTOTAL 1078* 1522* 1558*  CKMB 61.5* 103.1* 115.5*  CKMBINDEX -- -- --  TROPONINI >25.00* >25.00* >25.00*   Fasting Lipid Panel  Basename 08/24/11 0510  CHOL 172  HDL 59  LDLCALC 97  TRIG 81  CHOLHDL 2.9  LDLDIRECT --   Thyroid Function Tests  Basename 08/24/11 0010  TSH 1.318  T4TOTAL --  T3FREE --  THYROIDAB --   Disposition  Pt is being discharged home today in good condition.  Follow-up Plans & Appointments  Follow-up Information    Follow up with Tereso Newcomer, PA on 09/02/2011. (3:20 PM)    Contact information:   [address] [place] [address] [phone]       Follow up with HARRIS,W RANDALL. (As scheduled)         Discharge Medications  Medication List  As of 08/26/2011  4:12 PM   STOP taking these medications         cetirizine-pseudoephedrine 5-120 MG per tablet         TAKE these medications         aspirin 81 MG tablet   Take 81 mg by mouth daily.      atorvastatin 80 MG tablet   Commonly known as: LIPITOR   Take 1 tablet (80 mg total) by mouth daily at 6 PM.      cholecalciferol 1000 UNITS tablet   Commonly known as: VITAMIN D   Take 1,000 Units by mouth daily.      metoprolol succinate 25 MG 24 hr tablet   Commonly known as: TOPROL-XL   Take 1 tablet (25 mg  total) by mouth daily.      multivitamin tablet   Take 1 tablet by mouth daily.      nitroGLYCERIN 0.4 MG SL tablet   Commonly known as: NITROSTAT   Place 1 tablet (0.4 mg total) under the tongue every 5 (five) minutes x 3 doses as needed for chest pain.           Outstanding Labs/Studies  F/U lipids/lft's in 6-8 weeks (new statin Rx)  Duration of Discharge Encounter   Greater than 30 minutes including physician time.  Signed, Nicolasa Ducking NP 08/26/2011, 4:12 PM   Patient seen and examined.  Plan as discussed in my rounding note for today and outlined above. Rollene Rotunda  08/26/11

## 2011-08-26 NOTE — Progress Notes (Signed)
   SUBJECTIVE:  No chest pain.  No SOB.   PHYSICAL EXAM Filed Vitals:   08/25/11 1226 08/25/11 1521 08/25/11 2206 08/26/11 0500  BP: 106/70 108/68 101/66 92/58  Pulse: 85 92 80 78  Temp: 98 F (36.7 C) 97.9 F (36.6 C) 99.2 F (37.3 C) 99.8 F (37.7 C)  TempSrc: Oral Oral Oral Oral  Resp: 17 19 20 20   Height:      Weight:    140 lb 11.2 oz (63.821 kg)  SpO2: 100% 100% 97% 95%   General:  No distress Lungs:  Clear Heart:  RRR, no rub, no murmur Abdomen:  Positive bowel sounds, no rebound no guarding Extremities:  No edema  LABS: Lab Results  Component Value Date   CKTOTAL 1078* 08/24/2011   CKMB 61.5* 08/24/2011   TROPONINI >25.00* 08/24/2011   Results for orders placed during the hospital encounter of 08/23/11 (from the past 24 hour(s))  GLUCOSE, CAPILLARY     Status: Abnormal   Collection Time   08/25/11  8:28 AM      Component Value Range   Glucose-Capillary 112 (*) 70 - 99 (mg/dL)    Intake/Output Summary (Last 24 hours) at 08/26/11 0645 Last data filed at 08/25/11 1405  Gross per 24 hour  Intake  413.5 ml  Output      0 ml  Net  413.5 ml    EKG:  NSR rate 82 RSR' V1 and V2, no acute ST T wave changes. 08/26/2011  ASSESSMENT AND PLAN:  1) Non-STEMI (non-ST elevated myocardial infarction):  Diffuse stenosis of the first obtuse marginal branch with angiographic appearance suggestive of intramural hematoma.  Medical management.  Echocardiogram pending.  Mild LV dysfunction.   2) Dyslipidemia: On low dose satin.  3) Ventricular ectopy:  NSR, no ectopy.  I will continue a low dose of beta blocker at discharge. (Toprol XL 25 mg).  OK to discharge today.  Cardiac rehab should see her first.  See me in 10 - 14 days.  Fayrene Fearing [facility] 08/26/2011 6:45 AM

## 2011-09-02 ENCOUNTER — Ambulatory Visit (INDEPENDENT_AMBULATORY_CARE_PROVIDER_SITE_OTHER): Payer: BC Managed Care – PPO | Admitting: Physician Assistant

## 2011-09-02 ENCOUNTER — Encounter: Payer: Self-pay | Admitting: Physician Assistant

## 2011-09-02 VITALS — BP 94/62 | HR 61 | Ht 68.0 in | Wt 137.0 lb

## 2011-09-02 DIAGNOSIS — I2510 Atherosclerotic heart disease of native coronary artery without angina pectoris: Secondary | ICD-10-CM

## 2011-09-02 DIAGNOSIS — E785 Hyperlipidemia, unspecified: Secondary | ICD-10-CM

## 2011-09-02 DIAGNOSIS — H349 Unspecified retinal vascular occlusion: Secondary | ICD-10-CM

## 2011-09-02 DIAGNOSIS — I214 Non-ST elevation (NSTEMI) myocardial infarction: Secondary | ICD-10-CM

## 2011-09-02 DIAGNOSIS — H348192 Central retinal vein occlusion, unspecified eye, stable: Secondary | ICD-10-CM

## 2011-09-02 NOTE — Patient Instructions (Signed)
Your physician recommends that you schedule a follow-up appointment in: 6 weeks with Dr Antoine Poche Your physician recommends that you return for lab work in: 6 weeks (lipid and liver profile)

## 2011-09-02 NOTE — Progress Notes (Signed)
[address] [place], [place]  [address] Phone: [phone] Fax:  [phone]  Date:  09/02/2011   Name:  Kaylee Banks       DOB:  1961/02/08 MRN:  [id number]  PCP:  Dr. Holley Bouche  Primary Cardiologist:  Dr. Rollene Rotunda  Primary Electrophysiologist:  None    History of Present Illness: Kaylee Banks is a 51 y.o. female who presents for post hospital follow up.    She was admitted 3/16-3/19 with an NSTEMI.  She presented with bilateral arm heaviness and aching with chest pressure.  She developed an idioventricular rhythm in the emergency room.  Cardiac enzymes were positive.  2-D echo 08/26/11: EF 55-60%, normal diastolic function.  LHC 08/25/11: Diffuse narrowing throughout 2 subbranches of the first OM-appearance of the first OM was suggestive of intramural hematoma, focal wall motion abnormality in the anterolateral wall, EF 60%.  Medical therapy was continued.  She was maintained on aspirin, statin and low-dose beta blocker.  Labs: Hemoglobin 12.2, potassium 3.7, creatinine 0.53, LDL 97, TSH 1.318.  Since discharge, she has had several episodes of chest fullness.  This was always with activity.  It improved with rest.  She took one nitroglycerin with resolution each time.  She has not had a recurrence since 4 days ago.  She is walking 15 minutes 3 times a day without difficulty.  She denies shortness of breath.  She denies arm or jaw pain.  She denies associated nausea or diaphoresis. She denies syncope or near-syncope.  She denies orthopnea, PND or edema.  Past Medical History  Diagnosis Date  . Retinal vein occlusion     Right (July 2008)  . CAD (coronary artery disease)     a. nstemi 08/2011;  b. 3.18.2013 Cath - nonobs dzs with evidence of intramural hematoma w/in OM1 - Med Rx.;  c. Echo 3.19.2013:  EF 55-60%, NL wall motion   . Hyperlipidemia     Current Outpatient Prescriptions  Medication Sig Dispense Refill  . aspirin 81 MG  tablet Take 81 mg by mouth daily.      Marland Kitchen atorvastatin (LIPITOR) 80 MG tablet Take 1 tablet (80 mg total) by mouth daily at 6 PM.  30 tablet  6  . metoprolol succinate (TOPROL XL) 25 MG 24 hr tablet Take 1 tablet (25 mg total) by mouth daily.  30 tablet  6  . Multiple Vitamin (MULTIVITAMIN) tablet Take 1 tablet by mouth daily.        . nitroGLYCERIN (NITROSTAT) 0.4 MG SL tablet Place 1 tablet (0.4 mg total) under the tongue every 5 (five) minutes x 3 doses as needed for chest pain.  25 tablet  3  . cholecalciferol (VITAMIN D) 1000 UNITS tablet Take 1,000 Units by mouth daily.          Allergies: Allergies  Allergen Reactions  . Hydromorphone Hcl Nausea And Vomiting  . Oxycodone-Acetaminophen Itching    History  Substance Use Topics  . Smoking status: Never Smoker   . Smokeless tobacco: Never Used  . Alcohol Use: No     ROS:  Please see the history of present illness.    All other systems reviewed and negative.   PHYSICAL EXAM: VS:  BP 94/62  Pulse 61  Ht 5\' 8"  (1.727 m)  Wt 137 lb (62.143 kg)  BMI 20.83 kg/m2 Well nourished, well developed, in no acute distress HEENT: normal Neck: no JVD Endocrine: No thyromegaly Vascular: No carotid bruit Cardiac:  normal S1, S2; RRR; no murmur Lungs:  clear to auscultation bilaterally, no wheezing, rhonchi or rales Abd: soft, nontender, no hepatomegaly Ext: no edema; right wrist without hematoma or bruit Skin: warm and dry Neuro:  CNs 2-12 intact, no focal abnormalities noted  EKG:  Sinus rhythm, rate 61, normal ice, T wave inversion in 1, aVL  ASSESSMENT AND PLAN:  1. Non-STEMI (non-ST elevated myocardial infarction)  Discussed with Dr. Excell Seltzer.  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  With her hematoma, it is likely that she would experience some residual chest discomfort.  Her symptoms are not progressing.  She is due to start cardiac rehabilitation soon.  Her activity is progressing well  without limitation.  Continue aspirin and statin.  Followup with Dr. Antoine Poche in 6 weeks.   2. CAD (coronary artery disease)  Continue aspirin and statin.  Followup in 6 weeks.   3. Dyslipidemia  Check lipids and LFTs in 6 weeks.   4. Retinal vein occlusion  She had a complete workup with hematology at [facility].  This was apparently negative.    Luna Glasgow, PA-C  4:49 PM 09/02/2011

## 2011-09-03 ENCOUNTER — Other Ambulatory Visit: Payer: Self-pay | Admitting: Family Medicine

## 2011-09-03 DIAGNOSIS — Z1231 Encounter for screening mammogram for malignant neoplasm of breast: Secondary | ICD-10-CM

## 2011-09-11 ENCOUNTER — Encounter (HOSPITAL_COMMUNITY)
Admission: RE | Admit: 2011-09-11 | Discharge: 2011-09-11 | Disposition: A | Discharge status: Home / Self Care | Payer: BC Managed Care – PPO | Source: Ambulatory Visit | Attending: Cardiology | Admitting: Cardiology

## 2011-09-11 DIAGNOSIS — Z5189 Encounter for other specified aftercare: Secondary | ICD-10-CM | POA: Insufficient documentation

## 2011-09-11 DIAGNOSIS — I2510 Atherosclerotic heart disease of native coronary artery without angina pectoris: Secondary | ICD-10-CM | POA: Insufficient documentation

## 2011-09-11 DIAGNOSIS — I472 Ventricular tachycardia, unspecified: Secondary | ICD-10-CM | POA: Insufficient documentation

## 2011-09-11 DIAGNOSIS — Z79899 Other long term (current) drug therapy: Secondary | ICD-10-CM | POA: Insufficient documentation

## 2011-09-11 DIAGNOSIS — I214 Non-ST elevation (NSTEMI) myocardial infarction: Secondary | ICD-10-CM | POA: Insufficient documentation

## 2011-09-11 DIAGNOSIS — I4729 Other ventricular tachycardia: Secondary | ICD-10-CM | POA: Insufficient documentation

## 2011-09-11 DIAGNOSIS — E785 Hyperlipidemia, unspecified: Secondary | ICD-10-CM | POA: Insufficient documentation

## 2011-09-11 NOTE — Progress Notes (Signed)
Cardiac Rehab Medication Review by a Pharmacist  Does the patient  feel that his/her medications are working for him/her?  yes  Has the patient been experiencing any side effects to the medications prescribed?  no  Does the patient measure his/her own blood pressure or blood glucose at home?  yes   Does the patient have any problems obtaining medications due to transportation or finances?   no  Understanding of regimen: excellent Understanding of indications: excellent Potential of compliance: excellent      Juliann Pulse 09/11/2011 8:32 AM

## 2011-09-15 ENCOUNTER — Encounter (HOSPITAL_COMMUNITY)
Admission: RE | Admit: 2011-09-15 | Discharge: 2011-09-15 | Disposition: A | Discharge status: Home / Self Care | Payer: BC Managed Care – PPO | Source: Ambulatory Visit | Attending: Cardiology | Admitting: Cardiology

## 2011-09-15 ENCOUNTER — Ambulatory Visit (HOSPITAL_COMMUNITY): Payer: BC Managed Care – PPO

## 2011-09-15 NOTE — Progress Notes (Signed)
Pt started cardiac rehab today.  Pt tolerated light exercise without difficulty. Telemetry Sinus Rhythm. Will continue to monitor the patient throughout  the program.  

## 2011-09-17 ENCOUNTER — Encounter (HOSPITAL_COMMUNITY)
Admission: RE | Admit: 2011-09-17 | Discharge: 2011-09-17 | Disposition: A | Discharge status: Home / Self Care | Payer: BC Managed Care – PPO | Source: Ambulatory Visit | Attending: Cardiology | Admitting: Cardiology

## 2011-09-17 NOTE — Progress Notes (Signed)
Kaylee Banks 51 y.o. female       Nutrition Screen                                                                    YES  NO Do you live in a nursing home?  X   Do you eat out more than 3 times/week?    X If yes, how many times per week do you eat out?  Do you have food allergies?   X If yes, what are you allergic to?  Have you gained or lost more than 10 lbs without trying?               X If yes, how much weight have you lost and over what time period?  lbs gained or lost over  weeks/month  Do you want to lose weight?     X If yes, what is a goal weight or amount of weight you would like to lose?  lb  Do you eat alone most of the time?   X   Do you eat less than 2 meals/day?  X If yes, how many meals do you eat?  Do you drink more than 3 alcohol drinks/day?  X If yes, how many drinks per day?  Are you having trouble with constipation? *  X If yes, what are you doing to help relieve constipation?  Do you have financial difficulties with buying food?*    X   Are you experiencing regular nausea/ vomiting?*     X   Do you have a poor appetite? *                                        X   Do you have trouble chewing/swallowing? *   X    Pt with diagnoses of:  X Dyslipidemia  / HDL< 40 / LDL>70 / High TG      X MI       Pt Risk Score   0       Diagnosis Risk Score  10       Total Risk Score   10                         High Risk               X Low Risk    HT: 69.25" Ht Readings from Last 1 Encounters:  09/11/11 5' 9.25" (1.759 m)    WT:   137.5 lb (62.5 kg) Wt Readings from Last 3 Encounters:  09/11/11 137 lb 12.6 oz (62.5 kg)  09/02/11 137 lb (62.143 kg)  08/26/11 140 lb 11.2 oz (63.821 kg)     IBW 66.5 94%IBW BMI 20.2 29.5%body fat  Meds reviewed: MVI, Vitamin D Past Medical History  Diagnosis Date  . Retinal vein occlusion     Right (July 2008)  . CAD (coronary artery disease)     a. nstemi 08/2011;  b. 3.18.2013 Cath - nonobs dzs with evidence of intramural  hematoma w/in OM1 - Med Rx.;  c. Echo 3.19.2013:  EF 55-60%, NL  wall motion   . Hyperlipidemia        Activity level: Pt is active  Wt goal: 137-138 lb ( 62.3-62.7 kg) Current tobacco use? No Food/Drug Interaction? No Labs:  Lipid Panel     Component Value Date/Time   CHOL 172 08/24/2011 0510   TRIG 81 08/24/2011 0510   HDL 59 08/24/2011 0510   CHOLHDL 2.9 08/24/2011 0510   VLDL 16 08/24/2011 0510   LDLCALC 97 08/24/2011 0510   No results found for this basename: HGBA1C  LDL goal: < 100      MI and > 2:      HTN, HDL, family h/o, >49 yo female, tobacco, lipoprotein a  Estimated Daily Nutrition Needs for: ? wt maintenance  2000-2250 Kcal , Total Fat 65-75gm, Saturated Fat 15-17 gm, Trans Fat 2.2-2.5 gm,  Sodium less than 1500 mg

## 2011-09-19 ENCOUNTER — Encounter (HOSPITAL_COMMUNITY)
Admission: RE | Admit: 2011-09-19 | Discharge: 2011-09-19 | Disposition: A | Discharge status: Home / Self Care | Payer: BC Managed Care – PPO | Source: Ambulatory Visit | Attending: Cardiology | Admitting: Cardiology

## 2011-09-22 ENCOUNTER — Encounter (HOSPITAL_COMMUNITY)
Admission: RE | Admit: 2011-09-22 | Discharge: 2011-09-22 | Disposition: A | Discharge status: Home / Self Care | Payer: BC Managed Care – PPO | Source: Ambulatory Visit | Attending: Cardiology | Admitting: Cardiology

## 2011-09-22 NOTE — Progress Notes (Signed)
Reviewed home exercise with pt today.  Pt plans to walk around neighborhood for exercise.  Reviewed THR, pulse, RPE, sign and symptoms, NTG use, and when to call 911 or MD.  Pt voiced understanding. Fabio Pierce, MA, ACSM RCEP

## 2011-09-24 ENCOUNTER — Encounter (HOSPITAL_COMMUNITY)
Admission: RE | Admit: 2011-09-24 | Discharge: 2011-09-24 | Disposition: A | Discharge status: Home / Self Care | Payer: BC Managed Care – PPO | Source: Ambulatory Visit | Attending: Cardiology | Admitting: Cardiology

## 2011-09-26 ENCOUNTER — Encounter (HOSPITAL_COMMUNITY)
Admission: RE | Admit: 2011-09-26 | Discharge: 2011-09-26 | Disposition: A | Discharge status: Home / Self Care | Payer: BC Managed Care – PPO | Source: Ambulatory Visit | Attending: Cardiology | Admitting: Cardiology

## 2011-09-29 ENCOUNTER — Encounter (HOSPITAL_COMMUNITY)
Admission: RE | Admit: 2011-09-29 | Discharge: 2011-09-29 | Disposition: A | Discharge status: Home / Self Care | Payer: BC Managed Care – PPO | Source: Ambulatory Visit | Attending: Cardiology | Admitting: Cardiology

## 2011-10-01 ENCOUNTER — Encounter (HOSPITAL_COMMUNITY)
Admission: RE | Admit: 2011-10-01 | Discharge: 2011-10-01 | Disposition: A | Discharge status: Home / Self Care | Payer: BC Managed Care – PPO | Source: Ambulatory Visit | Attending: Cardiology | Admitting: Cardiology

## 2011-10-01 NOTE — Progress Notes (Signed)
Kaylee Banks visited [phone] exercise class today.

## 2011-10-03 ENCOUNTER — Encounter (HOSPITAL_COMMUNITY)
Admission: RE | Admit: 2011-10-03 | Discharge: 2011-10-03 | Disposition: A | Discharge status: Home / Self Care | Payer: BC Managed Care – PPO | Source: Ambulatory Visit | Attending: Cardiology | Admitting: Cardiology

## 2011-10-03 NOTE — Progress Notes (Signed)
Patient noted to have three PVC's during cool down.  Pt asymptomatic.  Continue to monitor.

## 2011-10-06 ENCOUNTER — Encounter (HOSPITAL_COMMUNITY)
Admission: RE | Admit: 2011-10-06 | Discharge: 2011-10-06 | Disposition: A | Discharge status: Home / Self Care | Payer: BC Managed Care – PPO | Source: Ambulatory Visit | Attending: Cardiology | Admitting: Cardiology

## 2011-10-08 ENCOUNTER — Encounter (HOSPITAL_COMMUNITY)
Admission: RE | Admit: 2011-10-08 | Discharge: 2011-10-08 | Disposition: A | Discharge status: Home / Self Care | Payer: BC Managed Care – PPO | Source: Ambulatory Visit | Attending: Cardiology | Admitting: Cardiology

## 2011-10-08 DIAGNOSIS — I472 Ventricular tachycardia, unspecified: Secondary | ICD-10-CM | POA: Insufficient documentation

## 2011-10-08 DIAGNOSIS — I2510 Atherosclerotic heart disease of native coronary artery without angina pectoris: Secondary | ICD-10-CM | POA: Insufficient documentation

## 2011-10-08 DIAGNOSIS — I214 Non-ST elevation (NSTEMI) myocardial infarction: Secondary | ICD-10-CM | POA: Insufficient documentation

## 2011-10-08 DIAGNOSIS — Z5189 Encounter for other specified aftercare: Secondary | ICD-10-CM | POA: Insufficient documentation

## 2011-10-08 DIAGNOSIS — E785 Hyperlipidemia, unspecified: Secondary | ICD-10-CM | POA: Insufficient documentation

## 2011-10-08 DIAGNOSIS — I4729 Other ventricular tachycardia: Secondary | ICD-10-CM | POA: Insufficient documentation

## 2011-10-08 DIAGNOSIS — Z79899 Other long term (current) drug therapy: Secondary | ICD-10-CM | POA: Insufficient documentation

## 2011-10-10 ENCOUNTER — Encounter (HOSPITAL_COMMUNITY)
Admission: RE | Admit: 2011-10-10 | Discharge: 2011-10-10 | Disposition: A | Discharge status: Home / Self Care | Payer: BC Managed Care – PPO | Source: Ambulatory Visit | Attending: Cardiology | Admitting: Cardiology

## 2011-10-13 ENCOUNTER — Encounter (HOSPITAL_COMMUNITY)
Admission: RE | Admit: 2011-10-13 | Discharge: 2011-10-13 | Disposition: A | Discharge status: Home / Self Care | Payer: BC Managed Care – PPO | Source: Ambulatory Visit | Attending: Cardiology | Admitting: Cardiology

## 2011-10-15 ENCOUNTER — Encounter (HOSPITAL_COMMUNITY)
Admission: RE | Admit: 2011-10-15 | Discharge: 2011-10-15 | Disposition: A | Discharge status: Home / Self Care | Payer: BC Managed Care – PPO | Source: Ambulatory Visit | Attending: Cardiology | Admitting: Cardiology

## 2011-10-16 ENCOUNTER — Encounter: Payer: Self-pay | Admitting: Cardiology

## 2011-10-16 ENCOUNTER — Other Ambulatory Visit (INDEPENDENT_AMBULATORY_CARE_PROVIDER_SITE_OTHER): Payer: BC Managed Care – PPO

## 2011-10-16 ENCOUNTER — Ambulatory Visit (INDEPENDENT_AMBULATORY_CARE_PROVIDER_SITE_OTHER): Payer: BC Managed Care – PPO | Admitting: Cardiology

## 2011-10-16 VITALS — BP 110/70 | HR 56 | Ht 68.0 in | Wt 135.8 lb

## 2011-10-16 DIAGNOSIS — E785 Hyperlipidemia, unspecified: Secondary | ICD-10-CM

## 2011-10-16 DIAGNOSIS — I2510 Atherosclerotic heart disease of native coronary artery without angina pectoris: Secondary | ICD-10-CM

## 2011-10-16 LAB — LIPID PANEL
Cholesterol: 86 mg/dL (ref 0–200)
HDL: 51.5 mg/dL (ref >39.00)
LDL Cholesterol: 20 mg/dL (ref 0–99)
Total CHOL/HDL Ratio: 2
Triglycerides: 72 mg/dL (ref 0.0–149.0)
VLDL: 14.4 mg/dL (ref 0.0–40.0)

## 2011-10-16 LAB — HEPATIC FUNCTION PANEL
ALT: 27 U/L (ref 0–35)
AST: 24 U/L (ref 0–37)
Albumin: 4.4 g/dL (ref 3.5–5.2)
Alkaline Phosphatase: 81 U/L (ref 39–117)
Bilirubin, Direct: 0.1 mg/dL (ref 0.0–0.3)
Total Bilirubin: 1.1 mg/dL (ref 0.3–1.2)
Total Protein: 7.1 g/dL (ref 6.0–8.3)

## 2011-10-16 NOTE — Assessment & Plan Note (Signed)
The patient has no new sypmtoms.  No further cardiovascular testing is indicated.  We will continue with aggressive risk reduction and meds as listed.  

## 2011-10-16 NOTE — Progress Notes (Signed)
   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  The patient denies any new symptoms such as chest discomfort, neck or arm discomfort. There has been no new shortness of breath, PND or orthopnea. There have been no reported palpitations, presyncope or syncope.  She is doing cardiac rehab.  Allergies  Allergen Reactions  . Hydromorphone Hcl Nausea And Vomiting  . Oxycodone-Acetaminophen Itching    Current Outpatient Prescriptions  Medication Sig Dispense Refill  . aspirin 81 MG tablet Take 81 mg by mouth daily.      Marland Kitchen atorvastatin (LIPITOR) 80 MG tablet Take 1 tablet (80 mg total) by mouth daily at 6 PM.  30 tablet  6  . cholecalciferol (VITAMIN D) 1000 UNITS tablet Take 1,000 Units by mouth 3 (three) times daily.       . metoprolol succinate (TOPROL XL) 25 MG 24 hr tablet Take 1 tablet (25 mg total) by mouth daily.  30 tablet  6  . Multiple Vitamin (MULTIVITAMIN) tablet Take 1 tablet by mouth daily. With calcium 200 mg and vitamin D 1000mg       . nitroGLYCERIN (NITROSTAT) 0.4 MG SL tablet Place 1 tablet (0.4 mg total) under the tongue every 5 (five) minutes x 3 doses as needed for chest pain.  25 tablet  3    Past Medical History  Diagnosis Date  . Retinal vein occlusion     Right (July 2008)  . CAD (coronary artery disease)     a. nstemi 08/2011;  b. 3.18.2013 Cath - nonobs dzs with evidence of intramural hematoma w/in OM1 - Med Rx.;  c. Echo 3.19.2013:  EF 55-60%, NL wall motion   . Hyperlipidemia     Past Surgical History  Procedure Date  . Cesarean section     x2  . Abdominal hysterectomy     ROS:  As stated in the HPI and negative for all other systems.  PHYSICAL EXAM BP 110/70  Pulse 56  Ht 5\' 8"  (1.727 m)  Wt 135 lb 12.8 oz (61.598 kg)  BMI 20.65 kg/m2 GENERAL:  Well appearing HEENT:  Pupils equal round and reactive, fundi not visualized, oral mucosa unremarkable NECK:  No jugular venous distention, waveform within normal limits, carotid  upstroke brisk and symmetric, no bruits, no thyromegaly LYMPHATICS:  No cervical, inguinal adenopathy LUNGS:  Clear to auscultation bilaterally BACK:  No CVA tenderness CHEST:  Unremarkable HEART:  PMI not displaced or sustained,S1 and S2 within normal limits, no S3, no S4, no clicks, no rubs, no murmurs ABD:  Flat, positive bowel sounds normal in frequency in pitch, no bruits, no rebound, no guarding, no midline pulsatile mass, no hepatomegaly, no splenomegaly EXT:  2 plus pulses throughout, no edema, no cyanosis no clubbing  ASSESSMENT AND PLAN;u

## 2011-10-16 NOTE — Patient Instructions (Signed)
The current medical regimen is effective;  continue present plan and medications.  Follow up in 6 months with Dr Hochrein.  You will receive a letter in the mail 2 months before you are due.  Please call us when you receive this letter to schedule your follow up appointment.  

## 2011-10-16 NOTE — Assessment & Plan Note (Signed)
She is to have a lipid profile drawn today.

## 2011-10-17 ENCOUNTER — Encounter (HOSPITAL_COMMUNITY)
Admission: RE | Admit: 2011-10-17 | Discharge: 2011-10-17 | Disposition: A | Discharge status: Home / Self Care | Payer: BC Managed Care – PPO | Source: Ambulatory Visit | Attending: Cardiology | Admitting: Cardiology

## 2011-10-17 ENCOUNTER — Telehealth: Payer: Self-pay | Admitting: *Deleted

## 2011-10-17 ENCOUNTER — Telehealth: Payer: Self-pay | Admitting: Cardiology

## 2011-10-17 NOTE — Telephone Encounter (Signed)
Fu call  °Pt returning your call about test results °

## 2011-10-17 NOTE — Telephone Encounter (Signed)
PT WANTED  LAB VALUES  NUMBERS GIVEN AT PT'S REQUEST  .Kaylee Banks

## 2011-10-17 NOTE — Telephone Encounter (Signed)
lmom labs great 

## 2011-10-17 NOTE — Telephone Encounter (Signed)
Message copied by Tarri Fuller on Fri Oct 17, 2011  3:58 PM ------      Message from: [place], [place] T      Created: Thu Oct 16, 2011  1:40 PM       [place], [place]  1:40 PM 10/16/2011

## 2011-10-20 ENCOUNTER — Encounter (HOSPITAL_COMMUNITY)
Admission: RE | Admit: 2011-10-20 | Discharge: 2011-10-20 | Disposition: A | Discharge status: Home / Self Care | Payer: BC Managed Care – PPO | Source: Ambulatory Visit | Attending: Cardiology | Admitting: Cardiology

## 2011-10-22 ENCOUNTER — Encounter (HOSPITAL_COMMUNITY)
Admission: RE | Admit: 2011-10-22 | Discharge: 2011-10-22 | Disposition: A | Discharge status: Home / Self Care | Payer: BC Managed Care – PPO | Source: Ambulatory Visit | Attending: Cardiology | Admitting: Cardiology

## 2011-10-24 ENCOUNTER — Encounter (HOSPITAL_COMMUNITY)
Admission: RE | Admit: 2011-10-24 | Discharge: 2011-10-24 | Disposition: A | Discharge status: Home / Self Care | Payer: BC Managed Care – PPO | Source: Ambulatory Visit | Attending: Cardiology | Admitting: Cardiology

## 2011-10-27 ENCOUNTER — Encounter (HOSPITAL_COMMUNITY)
Admission: RE | Admit: 2011-10-27 | Discharge: 2011-10-27 | Disposition: A | Discharge status: Home / Self Care | Payer: BC Managed Care – PPO | Source: Ambulatory Visit | Attending: Cardiology | Admitting: Cardiology

## 2011-10-29 ENCOUNTER — Encounter (HOSPITAL_COMMUNITY)
Admission: RE | Admit: 2011-10-29 | Discharge: 2011-10-29 | Disposition: A | Discharge status: Home / Self Care | Payer: BC Managed Care – PPO | Source: Ambulatory Visit | Attending: Cardiology | Admitting: Cardiology

## 2011-10-30 ENCOUNTER — Ambulatory Visit
Admission: RE | Admit: 2011-10-30 | Discharge: 2011-10-30 | Disposition: A | Discharge status: Home / Self Care | Payer: BC Managed Care – PPO | Source: Ambulatory Visit | Attending: Family Medicine | Admitting: Family Medicine

## 2011-10-30 DIAGNOSIS — Z1231 Encounter for screening mammogram for malignant neoplasm of breast: Secondary | ICD-10-CM

## 2011-10-31 ENCOUNTER — Encounter (HOSPITAL_COMMUNITY)
Admission: RE | Admit: 2011-10-31 | Discharge: 2011-10-31 | Disposition: A | Discharge status: Home / Self Care | Payer: BC Managed Care – PPO | Source: Ambulatory Visit | Attending: Cardiology | Admitting: Cardiology

## 2011-11-03 ENCOUNTER — Encounter (HOSPITAL_COMMUNITY): Payer: BC Managed Care – PPO

## 2011-11-05 ENCOUNTER — Encounter (HOSPITAL_COMMUNITY)
Admission: RE | Admit: 2011-11-05 | Discharge: 2011-11-05 | Disposition: A | Discharge status: Home / Self Care | Payer: BC Managed Care – PPO | Source: Ambulatory Visit | Attending: Cardiology | Admitting: Cardiology

## 2011-11-07 ENCOUNTER — Encounter (HOSPITAL_COMMUNITY): Payer: BC Managed Care – PPO

## 2011-11-07 ENCOUNTER — Encounter (HOSPITAL_COMMUNITY)
Admission: RE | Admit: 2011-11-07 | Discharge: 2011-11-07 | Disposition: A | Discharge status: Home / Self Care | Payer: BC Managed Care – PPO | Source: Ambulatory Visit | Attending: Cardiology | Admitting: Cardiology

## 2011-11-10 ENCOUNTER — Encounter (HOSPITAL_COMMUNITY)
Admission: RE | Admit: 2011-11-10 | Discharge: 2011-11-10 | Disposition: A | Discharge status: Home / Self Care | Payer: BC Managed Care – PPO | Source: Ambulatory Visit | Attending: Cardiology | Admitting: Cardiology

## 2011-11-10 DIAGNOSIS — I472 Ventricular tachycardia, unspecified: Secondary | ICD-10-CM | POA: Insufficient documentation

## 2011-11-10 DIAGNOSIS — E785 Hyperlipidemia, unspecified: Secondary | ICD-10-CM | POA: Insufficient documentation

## 2011-11-10 DIAGNOSIS — Z5189 Encounter for other specified aftercare: Secondary | ICD-10-CM | POA: Insufficient documentation

## 2011-11-10 DIAGNOSIS — I2510 Atherosclerotic heart disease of native coronary artery without angina pectoris: Secondary | ICD-10-CM | POA: Insufficient documentation

## 2011-11-10 DIAGNOSIS — Z79899 Other long term (current) drug therapy: Secondary | ICD-10-CM | POA: Insufficient documentation

## 2011-11-10 DIAGNOSIS — I4729 Other ventricular tachycardia: Secondary | ICD-10-CM | POA: Insufficient documentation

## 2011-11-10 DIAGNOSIS — I214 Non-ST elevation (NSTEMI) myocardial infarction: Secondary | ICD-10-CM | POA: Insufficient documentation

## 2011-11-12 ENCOUNTER — Encounter (HOSPITAL_COMMUNITY)
Admission: RE | Admit: 2011-11-12 | Discharge: 2011-11-12 | Disposition: A | Discharge status: Home / Self Care | Payer: BC Managed Care – PPO | Source: Ambulatory Visit | Attending: Cardiology | Admitting: Cardiology

## 2011-11-14 ENCOUNTER — Encounter (HOSPITAL_COMMUNITY)
Admission: RE | Admit: 2011-11-14 | Discharge: 2011-11-14 | Disposition: A | Discharge status: Home / Self Care | Payer: BC Managed Care – PPO | Source: Ambulatory Visit | Attending: Cardiology | Admitting: Cardiology

## 2011-11-14 ENCOUNTER — Encounter (HOSPITAL_COMMUNITY): Payer: BC Managed Care – PPO

## 2011-11-17 ENCOUNTER — Encounter (HOSPITAL_COMMUNITY)
Admission: RE | Admit: 2011-11-17 | Discharge: 2011-11-17 | Disposition: A | Discharge status: Home / Self Care | Payer: BC Managed Care – PPO | Source: Ambulatory Visit | Attending: Cardiology | Admitting: Cardiology

## 2011-11-17 ENCOUNTER — Encounter (HOSPITAL_COMMUNITY): Payer: BC Managed Care – PPO

## 2011-11-19 ENCOUNTER — Encounter (HOSPITAL_COMMUNITY): Payer: BC Managed Care – PPO

## 2011-11-19 ENCOUNTER — Encounter (HOSPITAL_COMMUNITY)
Admission: RE | Admit: 2011-11-19 | Discharge: 2011-11-19 | Disposition: A | Discharge status: Home / Self Care | Payer: BC Managed Care – PPO | Source: Ambulatory Visit | Attending: Cardiology | Admitting: Cardiology

## 2011-11-21 ENCOUNTER — Encounter (HOSPITAL_COMMUNITY)
Admission: RE | Admit: 2011-11-21 | Discharge: 2011-11-21 | Disposition: A | Discharge status: Home / Self Care | Payer: BC Managed Care – PPO | Source: Ambulatory Visit | Attending: Cardiology | Admitting: Cardiology

## 2011-11-21 ENCOUNTER — Encounter (HOSPITAL_COMMUNITY): Payer: BC Managed Care – PPO

## 2011-11-24 ENCOUNTER — Encounter (HOSPITAL_COMMUNITY): Payer: BC Managed Care – PPO

## 2011-11-24 ENCOUNTER — Encounter (HOSPITAL_COMMUNITY)
Admission: RE | Admit: 2011-11-24 | Discharge: 2011-11-24 | Disposition: A | Discharge status: Home / Self Care | Payer: BC Managed Care – PPO | Source: Ambulatory Visit | Attending: Cardiology | Admitting: Cardiology

## 2011-11-26 ENCOUNTER — Encounter (HOSPITAL_COMMUNITY)
Admission: RE | Admit: 2011-11-26 | Discharge: 2011-11-26 | Disposition: A | Discharge status: Home / Self Care | Payer: BC Managed Care – PPO | Source: Ambulatory Visit | Attending: Cardiology | Admitting: Cardiology

## 2011-11-26 ENCOUNTER — Encounter (HOSPITAL_COMMUNITY): Payer: BC Managed Care – PPO

## 2011-11-28 ENCOUNTER — Encounter (HOSPITAL_COMMUNITY)
Admission: RE | Admit: 2011-11-28 | Discharge: 2011-11-28 | Disposition: A | Discharge status: Home / Self Care | Payer: BC Managed Care – PPO | Source: Ambulatory Visit | Attending: Cardiology | Admitting: Cardiology

## 2011-11-28 ENCOUNTER — Encounter (HOSPITAL_COMMUNITY): Payer: BC Managed Care – PPO

## 2011-12-01 ENCOUNTER — Encounter (HOSPITAL_COMMUNITY)
Admission: RE | Admit: 2011-12-01 | Discharge: 2011-12-01 | Disposition: A | Discharge status: Home / Self Care | Payer: BC Managed Care – PPO | Source: Ambulatory Visit | Attending: Cardiology | Admitting: Cardiology

## 2011-12-01 ENCOUNTER — Encounter (HOSPITAL_COMMUNITY): Payer: BC Managed Care – PPO

## 2011-12-03 ENCOUNTER — Encounter (HOSPITAL_COMMUNITY)
Admission: RE | Admit: 2011-12-03 | Discharge: 2011-12-03 | Disposition: A | Discharge status: Home / Self Care | Payer: BC Managed Care – PPO | Source: Ambulatory Visit | Attending: Cardiology | Admitting: Cardiology

## 2011-12-03 ENCOUNTER — Encounter (HOSPITAL_COMMUNITY): Payer: BC Managed Care – PPO

## 2011-12-05 ENCOUNTER — Encounter (HOSPITAL_COMMUNITY): Payer: Self-pay

## 2011-12-05 ENCOUNTER — Encounter (HOSPITAL_COMMUNITY)
Admission: RE | Admit: 2011-12-05 | Discharge: 2011-12-05 | Disposition: A | Discharge status: Home / Self Care | Payer: BC Managed Care – PPO | Source: Ambulatory Visit | Attending: Cardiology | Admitting: Cardiology

## 2011-12-05 ENCOUNTER — Encounter (HOSPITAL_COMMUNITY): Payer: BC Managed Care – PPO

## 2011-12-08 ENCOUNTER — Encounter (HOSPITAL_COMMUNITY): Payer: BC Managed Care – PPO

## 2011-12-10 ENCOUNTER — Encounter (HOSPITAL_COMMUNITY): Payer: BC Managed Care – PPO

## 2011-12-12 ENCOUNTER — Encounter (HOSPITAL_COMMUNITY): Payer: BC Managed Care – PPO

## 2011-12-15 ENCOUNTER — Encounter (HOSPITAL_COMMUNITY): Payer: BC Managed Care – PPO

## 2011-12-17 ENCOUNTER — Encounter (HOSPITAL_COMMUNITY): Payer: BC Managed Care – PPO

## 2011-12-19 ENCOUNTER — Encounter (HOSPITAL_COMMUNITY): Payer: BC Managed Care – PPO

## 2012-01-08 DIAGNOSIS — I2510 Atherosclerotic heart disease of native coronary artery without angina pectoris: Secondary | ICD-10-CM

## 2012-01-08 HISTORY — DX: Atherosclerotic heart disease of native coronary artery without angina pectoris: I25.10

## 2012-03-15 ENCOUNTER — Other Ambulatory Visit (HOSPITAL_COMMUNITY): Payer: Self-pay | Admitting: Nurse Practitioner

## 2012-04-15 ENCOUNTER — Encounter: Payer: Self-pay | Admitting: Cardiology

## 2012-04-15 ENCOUNTER — Ambulatory Visit: Payer: BC Managed Care – PPO | Admitting: Cardiology

## 2012-04-15 ENCOUNTER — Ambulatory Visit (INDEPENDENT_AMBULATORY_CARE_PROVIDER_SITE_OTHER): Payer: BC Managed Care – PPO | Admitting: Cardiology

## 2012-04-15 VITALS — BP 115/73 | HR 48 | Ht 68.0 in | Wt 123.0 lb

## 2012-04-15 DIAGNOSIS — E7800 Pure hypercholesterolemia, unspecified: Secondary | ICD-10-CM

## 2012-04-15 DIAGNOSIS — I2510 Atherosclerotic heart disease of native coronary artery without angina pectoris: Secondary | ICD-10-CM

## 2012-04-15 MED ORDER — ATORVASTATIN CALCIUM 40 MG PO TABS: 40.0000 mg | ORAL_TABLET | Freq: Every day | ORAL | Status: DC

## 2012-04-15 NOTE — Progress Notes (Signed)
    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  She did take NTG one time on the 23rd of October for chest and neck pain that was not like her MI.  She otherwise has had no chest, arm or neck pain.  She completed cardiac rehab and has been exercising almost daily on her own  There has been no new shortness of breath, PND or orthopnea. There have been no reported palpitations, presyncope or syncope.    Allergies  Allergen Reactions  . Hydromorphone Hcl Nausea And Vomiting  . Oxycodone-Acetaminophen Itching    Current Outpatient Prescriptions  Medication Sig Dispense Refill  . aspirin 81 MG tablet Take 81 mg by mouth daily.      Marland Kitchen atorvastatin (LIPITOR) 80 MG tablet take 1 tablet by mouth once daily AT 6:00 PM.  30 tablet  6  . cholecalciferol (VITAMIN D) 1000 UNITS tablet Take 1,000 Units by mouth 3 (three) times daily.       . metoprolol succinate (TOPROL-XL) 25 MG 24 hr tablet take 1 tablet by mouth once daily  30 tablet  6  . Multiple Vitamin (MULTIVITAMIN) tablet Take 1 tablet by mouth daily. With calcium 200 mg and vitamin D 1000mg       . nitroGLYCERIN (NITROSTAT) 0.4 MG SL tablet Place 1 tablet (0.4 mg total) under the tongue every 5 (five) minutes x 3 doses as needed for chest pain.  25 tablet  3    Past Medical History  Diagnosis Date  . Retinal vein occlusion     Right (July 2008)  . CAD (coronary artery disease)     a. nstemi 08/2011;  b. 3.18.2013 Cath - nonobs dzs with evidence of intramural hematoma w/in OM1 - Med Rx.;  c. Echo 3.19.2013:  EF 55-60%, NL wall motion   . Hyperlipidemia     Past Surgical History  Procedure Date  . Cesarean section     x2  . Abdominal hysterectomy     ROS:  As stated in the HPI and negative for all other systems.  PHYSICAL EXAM BP 115/73  Pulse 48  Ht 5\' 8"  (1.727 m)  Wt 123 lb (55.792 kg)  BMI 18.70 kg/m2 GENERAL:  Well appearing HEENT:  Pupils equal round and reactive, fundi not visualized, oral  mucosa unremarkable NECK:  No jugular venous distention, waveform within normal limits, carotid upstroke brisk and symmetric, no bruits, no thyromegaly LYMPHATICS:  No cervical, inguinal adenopathy LUNGS:  Clear to auscultation bilaterally BACK:  No CVA tenderness CHEST:  Unremarkable HEART:  PMI not displaced or sustained,S1 and S2 within normal limits, no S3, no S4, no clicks, no rubs, no murmurs ABD:  Flat, positive bowel sounds normal in frequency in pitch, no bruits, no rebound, no guarding, no midline pulsatile mass, no hepatomegaly, no splenomegaly EXT:  2 plus pulses throughout, no edema, no cyanosis no clubbing  EKG:  Sinus rhythm, rate 48, axis within normal limits, intervals within normal limits, no acute ST-T wave changes.  ASSESSMENT AND PLAN;  CAD (coronary artery disease) -  The patient has no new sypmtoms. No further cardiovascular testing is indicated. We will continue with aggressive risk reduction and meds as listed.  We discussed specifics about her exercise.  Dyslipidemia -  Her last LDL was 20.  I will reduce the Lipitor to 40 mg and repeat a lipid and liver in 8 weeks.

## 2012-04-15 NOTE — Patient Instructions (Addendum)
Please take Lipitor 40 mg a day Continue all other medications as listed  Please have fasting blood work in 8 weeks  Follow up in 6 months with Dr Antoine Poche.  You will receive a letter in the mail 2 months before you are due.  Please call us when you receive this letter to schedule your follow up appointment.

## 2012-04-22 ENCOUNTER — Other Ambulatory Visit (HOSPITAL_COMMUNITY): Payer: Self-pay | Admitting: Family Medicine

## 2012-04-22 DIAGNOSIS — R29898 Other symptoms and signs involving the musculoskeletal system: Secondary | ICD-10-CM

## 2012-04-22 DIAGNOSIS — M542 Cervicalgia: Secondary | ICD-10-CM

## 2012-05-15 ENCOUNTER — Ambulatory Visit (HOSPITAL_COMMUNITY)
Admission: RE | Admit: 2012-05-15 | Discharge: 2012-05-15 | Disposition: A | Discharge status: Home / Self Care | Payer: BC Managed Care – PPO | Source: Ambulatory Visit | Attending: Family Medicine | Admitting: Family Medicine

## 2012-05-15 DIAGNOSIS — R29898 Other symptoms and signs involving the musculoskeletal system: Secondary | ICD-10-CM | POA: Insufficient documentation

## 2012-05-15 DIAGNOSIS — M5020 Other cervical disc displacement, unspecified cervical region: Secondary | ICD-10-CM | POA: Insufficient documentation

## 2012-05-15 DIAGNOSIS — M542 Cervicalgia: Secondary | ICD-10-CM | POA: Insufficient documentation

## 2012-05-15 DIAGNOSIS — M5030 Other cervical disc degeneration, unspecified cervical region: Secondary | ICD-10-CM | POA: Insufficient documentation

## 2012-05-20 ENCOUNTER — Other Ambulatory Visit: Payer: Self-pay | Admitting: Dermatology

## 2012-06-10 ENCOUNTER — Other Ambulatory Visit (INDEPENDENT_AMBULATORY_CARE_PROVIDER_SITE_OTHER): Payer: BC Managed Care – PPO

## 2012-06-10 DIAGNOSIS — E7800 Pure hypercholesterolemia, unspecified: Secondary | ICD-10-CM

## 2012-06-10 LAB — LIPID PANEL
Cholesterol: 93 mg/dL (ref 0–200)
HDL: 51.2 mg/dL (ref >39.00)
LDL Cholesterol: 29 mg/dL (ref 0–99)
Total CHOL/HDL Ratio: 2
Triglycerides: 62 mg/dL (ref 0.0–149.0)
VLDL: 12.4 mg/dL (ref 0.0–40.0)

## 2012-06-22 ENCOUNTER — Telehealth: Payer: Self-pay | Admitting: Cardiology

## 2012-06-22 NOTE — Telephone Encounter (Signed)
New problem:   1. Test results .     2. Should lipitor be decrease to lower dose .

## 2012-06-22 NOTE — Telephone Encounter (Signed)
Reviewed results with pt who states understanding and will continue medication as listed

## 2012-07-01 ENCOUNTER — Other Ambulatory Visit: Payer: Self-pay | Admitting: Dermatology

## 2012-08-06 ENCOUNTER — Other Ambulatory Visit: Payer: Self-pay

## 2012-08-06 DIAGNOSIS — Z1231 Encounter for screening mammogram for malignant neoplasm of breast: Secondary | ICD-10-CM

## 2012-10-12 ENCOUNTER — Encounter: Payer: Self-pay | Admitting: Cardiology

## 2012-10-12 ENCOUNTER — Ambulatory Visit (INDEPENDENT_AMBULATORY_CARE_PROVIDER_SITE_OTHER): Payer: BC Managed Care – PPO | Admitting: Cardiology

## 2012-10-12 VITALS — BP 118/74 | HR 50 | Ht 68.0 in | Wt 125.6 lb

## 2012-10-12 DIAGNOSIS — Z79899 Other long term (current) drug therapy: Secondary | ICD-10-CM

## 2012-10-12 DIAGNOSIS — I214 Non-ST elevation (NSTEMI) myocardial infarction: Secondary | ICD-10-CM

## 2012-10-12 DIAGNOSIS — E7800 Pure hypercholesterolemia, unspecified: Secondary | ICD-10-CM

## 2012-10-12 DIAGNOSIS — I2510 Atherosclerotic heart disease of native coronary artery without angina pectoris: Secondary | ICD-10-CM

## 2012-10-12 DIAGNOSIS — I4949 Other premature depolarization: Secondary | ICD-10-CM

## 2012-10-12 DIAGNOSIS — I493 Ventricular premature depolarization: Secondary | ICD-10-CM

## 2012-10-12 MED ORDER — ATORVASTATIN CALCIUM 20 MG PO TABS: 20.0000 mg | ORAL_TABLET | Freq: Every day | ORAL | Status: DC

## 2012-10-12 NOTE — Patient Instructions (Addendum)
Please decrease your Lipitor to 20 mg a day Continue all other medications as listed  Please return for fasting lab work (Lipid and Liver)  Follow up in 1 year with Dr Antoine Poche.  You will receive a letter in the mail 2 months before you are due.  Please call us when you receive this letter to schedule your follow up appointment.

## 2012-10-12 NOTE — Progress Notes (Signed)
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  Since I last saw her she did have burning chest discomfort in early January and did take 2 nitroglycerin. However, this was different than her previous angina.  She has since not had any discomfort. She exercises routinely walking 45 minutes a day without limitations. She otherwise has had no chest, arm or neck pain. There has been no new shortness of breath, PND or orthopnea. There have been no reported palpitations, presyncope or syncope.    Allergies  Allergen Reactions  . Hydromorphone Hcl Nausea And Vomiting  . Oxycodone-Acetaminophen Itching    Current Outpatient Prescriptions  Medication Sig Dispense Refill  . aspirin 81 MG tablet Take 81 mg by mouth daily.      Marland Kitchen atorvastatin (LIPITOR) 40 MG tablet Take 1 tablet (40 mg total) by mouth daily.  30 tablet  6  . cholecalciferol (VITAMIN D) 1000 UNITS tablet Take 1,000 Units by mouth 3 (three) times daily.       . metoprolol succinate (TOPROL-XL) 25 MG 24 hr tablet take 1 tablet by mouth once daily  30 tablet  6  . Multiple Vitamin (MULTIVITAMIN) tablet Take 1 tablet by mouth daily. With calcium 200 mg and vitamin D 1000mg       . nitroGLYCERIN (NITROSTAT) 0.4 MG SL tablet Place 1 tablet (0.4 mg total) under the tongue every 5 (five) minutes x 3 doses as needed for chest pain.  25 tablet  3   No current facility-administered medications for this visit.    Past Medical History  Diagnosis Date  . Retinal vein occlusion     Right (July 2008)  . CAD (coronary artery disease)     a. nstemi 08/2011;  b. 3.18.2013 Cath - nonobs dzs with evidence of intramural hematoma w/in OM1 - Med Rx.;  c. Echo 3.19.2013:  EF 55-60%, NL wall motion   . Hyperlipidemia     Past Surgical History  Procedure Laterality Date  . Cesarean section      x2  . Abdominal hysterectomy      ROS:  As stated in the HPI and negative for all other systems.  PHYSICAL EXAM BP 118/74   Pulse 50  Ht 5\' 8"  (1.727 m)  Wt 125 lb 9.6 oz (56.972 kg)  BMI 19.1 kg/m2 GENERAL:  Well appearing HEENT:  Pupils equal round and reactive, fundi not visualized, oral mucosa unremarkable NECK:  No jugular venous distention, waveform within normal limits, carotid upstroke brisk and symmetric, no bruits, no thyromegaly LYMPHATICS:  No cervical, inguinal adenopathy LUNGS:  Clear to auscultation bilaterally BACK:  No CVA tenderness CHEST:  Unremarkable HEART:  PMI not displaced or sustained,S1 and S2 within normal limits, no S3, no S4, no clicks, no rubs, no murmurs ABD:  Flat, positive bowel sounds normal in frequency in pitch, no bruits, no rebound, no guarding, no midline pulsatile mass, no hepatomegaly, no splenomegaly EXT:  2 plus pulses throughout, no edema, no cyanosis no clubbing  EKG:  Sinus rhythm, rate 50, axis within normal limits, intervals within normal limits, no acute ST-T wave changes.  10/12/2012  ASSESSMENT AND PLAN;  CAD (coronary artery disease) -  The patient has no new sypmtoms. No further cardiovascular testing is indicated. We will continue with aggressive risk reduction and meds as listed.  We discussed specifics about her exercise.  Dyslipidemia -  Given her last LDL still down as low as 29 I will further reduce the Lipitor to  20 mg daily and check a basic metabolic profile and liver enzymes in 10 weeks.

## 2012-10-13 ENCOUNTER — Ambulatory Visit: Payer: Self-pay | Admitting: Obstetrics & Gynecology

## 2012-10-15 ENCOUNTER — Ambulatory Visit: Payer: Self-pay | Admitting: Obstetrics & Gynecology

## 2012-10-19 ENCOUNTER — Other Ambulatory Visit: Payer: Self-pay

## 2012-10-19 MED ORDER — METOPROLOL SUCCINATE ER 25 MG PO TB24: ORAL_TABLET | ORAL | Status: DC

## 2012-10-19 NOTE — Telephone Encounter (Signed)
pt called to rqst refill for metoprolol.refill completed pt aware

## 2012-10-22 ENCOUNTER — Ambulatory Visit (INDEPENDENT_AMBULATORY_CARE_PROVIDER_SITE_OTHER): Payer: BC Managed Care – PPO | Admitting: Obstetrics & Gynecology

## 2012-10-22 ENCOUNTER — Encounter: Payer: Self-pay | Admitting: Obstetrics & Gynecology

## 2012-10-22 VITALS — BP 100/60 | HR 56 | Ht 68.0 in | Wt 126.0 lb

## 2012-10-22 DIAGNOSIS — Z01419 Encounter for gynecological examination (general) (routine) without abnormal findings: Secondary | ICD-10-CM

## 2012-10-22 DIAGNOSIS — Z0000 Encounter for general adult medical examination without abnormal findings: Secondary | ICD-10-CM

## 2012-10-22 LAB — POCT URINALYSIS DIPSTICK
Bilirubin, UA: NEGATIVE
Blood, UA: NEGATIVE
Glucose, UA: NEGATIVE
Ketones, UA: NEGATIVE
Leukocytes, UA: NEGATIVE
Nitrite, UA: NEGATIVE
Protein, UA: NEGATIVE
Urobilinogen, UA: NEGATIVE
pH, UA: 7.5

## 2012-10-22 LAB — HEMOGLOBIN, FINGERSTICK: Hemoglobin, fingerstick: 13.6 g/dL (ref 12.0–16.0)

## 2012-10-22 NOTE — Patient Instructions (Signed)

## 2012-10-22 NOTE — Progress Notes (Signed)
Patient ID: Kaylee Banks, female   DOB: [birth year], 52 y.o.   MRN: [id number]  52 y.o. [id number] MarriedCaucasianF here for annual exam.  Doing well.  No vaginal bleeding.  Seeing PCP every year.  Cholesterol was normal.  Lost weight due to cardiac rehab.  No new issues from cardiac standpoint.  Now seeing cardiology yearly.   No LMP recorded. Patient has had a hysterectomy.          Sexually active: yes  The current method of family planning is status post hysterectomy.    Exercising: yes  brisk walking 5-7 times week Smoker:  no  Health Maintenance: Pap:  04/08/07 History of abnormal Pap:  no MMG:  10/30/11 Colonoscopy:  2010, Dr. Juanda Chance, follow-up 10 years BMD:   10/29/09, normal TDaP:  05/10/07 Screening Labs: PCP, Hb today: 13.6, Urine today: neg    reports that she has never smoked. She has never used smokeless tobacco. She reports that she does not drink alcohol or use illicit drugs.  Past Medical History  Diagnosis Date  . Retinal vein occlusion     Right (July 2008)  . CAD (coronary artery disease)     a. nstemi 08/2011;  b. 3.18.2013 Cath - nonobs dzs with evidence of intramural hematoma w/in OM1 - Med Rx.;  c. Echo 3.19.2013:  EF 55-60%, NL wall motion   . Hyperlipidemia   . Endometriosis 1989  . BCC (basal cell carcinoma), leg     right    Past Surgical History  Procedure Laterality Date  . Cesarean section  2000, 2002    x2  . Total abdominal hysterectomy w/ bilateral salpingoophorectomy  12/08  . Endometrial biopsy  11/08    neg    Current Outpatient Prescriptions  Medication Sig Dispense Refill  . aspirin 81 MG tablet Take 81 mg by mouth daily.      Marland Kitchen atorvastatin (LIPITOR) 20 MG tablet Take 1 tablet (20 mg total) by mouth daily.  30 tablet  6  . cholecalciferol (VITAMIN D) 1000 UNITS tablet Take 1,000 Units by mouth 3 (three) times daily.       . metoprolol succinate (TOPROL-XL) 25 MG 24 hr tablet take 1 tablet by mouth once daily  30 tablet  6  .  Multiple Vitamin (MULTIVITAMIN) tablet Take 1 tablet by mouth daily. With calcium 200 mg and vitamin D 1000mg       . nitroGLYCERIN (NITROSTAT) 0.4 MG SL tablet Place 1 tablet (0.4 mg total) under the tongue every 5 (five) minutes x 3 doses as needed for chest pain.  25 tablet  3   No current facility-administered medications for this visit.    Family History  Problem Relation Age of Onset  . Hyperlipidemia Father 20  . Hyperlipidemia Mother 72  . Hypertension Mother 38  . Heart disease Maternal Grandmother 28    Multiple MIs  . Multiple births      Mat. G-aunt, quadruplets  . Diabetes Maternal Aunt   . Cancer - Lung      grandfather  . Cancer      abdominal, grandfather    ROS:  Pertinent items are noted in HPI.  Otherwise, a comprehensive ROS was negative.  Exam:   BP 100/60  Pulse 56  Ht 5\' 8"  (1.727 m)  Wt 126 lb (57.153 kg)  BMI 19.16 kg/m2  Weight change: -9lb  Height: 5\' 8"  (172.7 cm)  Ht Readings from Last 3 Encounters:  10/22/12 5\' 8"  (1.727 m)  10/12/12  5\' 8"  (1.727 m)  04/15/12 5\' 8"  (1.727 m)    General appearance: alert, cooperative and appears stated age Head: Normocephalic, without obvious abnormality, atraumatic Neck: no adenopathy, supple, symmetrical, trachea midline and thyroid normal to inspection and palpation Lungs: clear to auscultation bilaterally Breasts: normal appearance, no masses or tenderness Heart: regular rate and rhythm Abdomen: soft, non-tender; bowel sounds normal; no masses,  no organomegaly Extremities: extremities normal, atraumatic, no cyanosis or edema Skin: Skin color, texture, turgor normal. No rashes or lesions Lymph nodes: Cervical, supraclavicular, and axillary nodes normal. No abnormal inguinal nodes palpated Neurologic: Grossly normal   Pelvic: External genitalia:  no lesions              Urethra:  normal appearing urethra with no masses, tenderness or lesions              Bartholins and Skenes: normal                  Vagina: normal appearing vagina with normal color and discharge, no lesions              Cervix: absent              Pap taken: no Bimanual Exam:  Uterus:  uterus absent              Adnexa: no mass, fullness, tenderness               Rectovaginal: Confirms               Anus:  normal sphincter tone, no lesions  A:  Well Woman with normal exam PMP, no HRT H/O retinal vein occlusion 7/08 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 D/W pt 3D MMG due to dense breasts No pap smear indicated due to H/O TAH/BSO Labs with cardiologist and PCP return annually or prn  An After Visit Summary was printed and given to the patient.

## 2012-10-27 ENCOUNTER — Ambulatory Visit
Admission: RE | Admit: 2012-10-27 | Discharge: 2012-10-27 | Disposition: A | Discharge status: Home / Self Care | Payer: BC Managed Care – PPO | Source: Ambulatory Visit

## 2012-10-27 DIAGNOSIS — Z1231 Encounter for screening mammogram for malignant neoplasm of breast: Secondary | ICD-10-CM

## 2012-11-02 ENCOUNTER — Ambulatory Visit: Payer: BC Managed Care – PPO

## 2012-12-21 ENCOUNTER — Other Ambulatory Visit (INDEPENDENT_AMBULATORY_CARE_PROVIDER_SITE_OTHER): Payer: BC Managed Care – PPO

## 2012-12-21 DIAGNOSIS — Z79899 Other long term (current) drug therapy: Secondary | ICD-10-CM

## 2012-12-21 DIAGNOSIS — E7800 Pure hypercholesterolemia, unspecified: Secondary | ICD-10-CM

## 2012-12-21 LAB — HEPATIC FUNCTION PANEL
ALT: 21 U/L (ref 0–35)
AST: 22 U/L (ref 0–37)
Albumin: 4.3 g/dL (ref 3.5–5.2)
Alkaline Phosphatase: 60 U/L (ref 39–117)
Bilirubin, Direct: 0.1 mg/dL (ref 0.0–0.3)
Total Bilirubin: 0.9 mg/dL (ref 0.3–1.2)
Total Protein: 7.1 g/dL (ref 6.0–8.3)

## 2012-12-21 LAB — LIPID PANEL
Cholesterol: 102 mg/dL (ref 0–200)
HDL: 52.2 mg/dL (ref >39.00)
LDL Cholesterol: 33 mg/dL (ref 0–99)
Total CHOL/HDL Ratio: 2
Triglycerides: 85 mg/dL (ref 0.0–149.0)
VLDL: 17 mg/dL (ref 0.0–40.0)

## 2012-12-28 ENCOUNTER — Other Ambulatory Visit: Payer: Self-pay | Admitting: *Deleted

## 2012-12-28 MED ORDER — ATORVASTATIN CALCIUM 20 MG PO TABS: 20.0000 mg | ORAL_TABLET | Freq: Every day | ORAL | Status: DC

## 2013-01-01 ENCOUNTER — Other Ambulatory Visit: Payer: Self-pay | Admitting: Cardiology

## 2013-01-02 ENCOUNTER — Other Ambulatory Visit (HOSPITAL_COMMUNITY): Payer: Self-pay | Admitting: Nurse Practitioner

## 2013-01-07 ENCOUNTER — Telehealth: Payer: Self-pay | Admitting: Cardiology

## 2013-01-07 NOTE — Telephone Encounter (Signed)
New Prob  Pt states she thought her prescription for LIPITOR 40 MG was supposed to be decreased to 20 MG.  She said she just picked it up and it still has 40 MG.  Just wants some clarification.

## 2013-01-07 NOTE — Telephone Encounter (Signed)
Pt called because on the last office visit with Dr. Antoine Poche Md recommended for pt to decrease the Lipitor to 20 mg once daily; and to get labs on July 15 th the results were fine, then Dr. Jenene Slicker nurse called to see if pt was ready for the office to send the prescription for the 20 mg Lipitor;  the 40 mg dose was refilled instead, pt. Would like to know if she needs to stay on the 20 mg or go back to the 40 mg dose. Pt was made aware that according to MD's note on 10/12/12 pt is to take 20 mg Lipitor daily. In the pt's  Chart had the 20 mg and 40 mg Lipitor prescription listed as taking. Lipitor 40 mg was removed from the pt's medication list at this time. Pt is aware.

## 2013-02-24 ENCOUNTER — Other Ambulatory Visit: Payer: Self-pay | Admitting: Dermatology

## 2013-04-14 ENCOUNTER — Other Ambulatory Visit: Payer: Self-pay

## 2013-04-16 ENCOUNTER — Other Ambulatory Visit: Payer: Self-pay | Admitting: Cardiology

## 2013-04-18 ENCOUNTER — Other Ambulatory Visit: Payer: Self-pay | Admitting: *Deleted

## 2013-04-18 MED ORDER — ATORVASTATIN CALCIUM 20 MG PO TABS: 20.0000 mg | ORAL_TABLET | Freq: Every day | ORAL | Status: DC

## 2013-04-22 ENCOUNTER — Other Ambulatory Visit: Payer: Self-pay | Admitting: Family Medicine

## 2013-04-22 ENCOUNTER — Ambulatory Visit
Admission: RE | Admit: 2013-04-22 | Discharge: 2013-04-22 | Disposition: A | Discharge status: Home / Self Care | Payer: BC Managed Care – PPO | Source: Ambulatory Visit | Attending: Family Medicine | Admitting: Family Medicine

## 2013-04-22 DIAGNOSIS — M533 Sacrococcygeal disorders, not elsewhere classified: Secondary | ICD-10-CM

## 2013-04-25 ENCOUNTER — Telehealth: Payer: Self-pay | Admitting: *Deleted

## 2013-04-25 MED ORDER — ATORVASTATIN CALCIUM 20 MG PO TABS: 20.0000 mg | ORAL_TABLET | Freq: Every day | ORAL | Status: DC

## 2013-04-25 NOTE — Telephone Encounter (Signed)
Pharmacy is telling patient all they have is order for 10 mg lipitor, on our record its 20 mg, will resend this script

## 2013-05-11 ENCOUNTER — Other Ambulatory Visit: Payer: Self-pay | Admitting: Cardiology

## 2013-07-13 ENCOUNTER — Other Ambulatory Visit: Payer: Self-pay

## 2013-07-13 DIAGNOSIS — Z1231 Encounter for screening mammogram for malignant neoplasm of breast: Secondary | ICD-10-CM

## 2013-10-17 ENCOUNTER — Encounter: Payer: Self-pay | Admitting: Cardiology

## 2013-10-17 ENCOUNTER — Ambulatory Visit (INDEPENDENT_AMBULATORY_CARE_PROVIDER_SITE_OTHER): Payer: BC Managed Care – PPO | Admitting: Cardiology

## 2013-10-17 VITALS — BP 110/62 | HR 49 | Ht 68.0 in | Wt 129.0 lb

## 2013-10-17 DIAGNOSIS — I2510 Atherosclerotic heart disease of native coronary artery without angina pectoris: Secondary | ICD-10-CM

## 2013-10-17 LAB — HEPATIC FUNCTION PANEL
ALT: 25 U/L (ref 0–35)
AST: 25 U/L (ref 0–37)
Albumin: 4.3 g/dL (ref 3.5–5.2)
Alkaline Phosphatase: 65 U/L (ref 39–117)
Bilirubin, Direct: 0.2 mg/dL (ref 0.0–0.3)
Total Bilirubin: 1.3 mg/dL — ABNORMAL HIGH (ref 0.2–1.2)
Total Protein: 7 g/dL (ref 6.0–8.3)

## 2013-10-17 LAB — LIPID PANEL
Cholesterol: 104 mg/dL (ref 0–200)
HDL: 60.1 mg/dL (ref >39.00)
LDL Cholesterol: 35 mg/dL (ref 0–99)
Total CHOL/HDL Ratio: 2
Triglycerides: 47 mg/dL (ref 0.0–149.0)
VLDL: 9.4 mg/dL (ref 0.0–40.0)

## 2013-10-17 NOTE — Progress Notes (Signed)
   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  She has had some rare discomfort.  She reports taking 2 NTG since I last saw her.  She has been active although not exercising right now.  With her level of activity she denies any chest pressure, neck or arm discomfort. She has not had any palpitations, presyncope or syncope. She denies any PND or orthopnea. She has had no weight gain or edema.  Allergies  Allergen Reactions  . Hydromorphone Hcl Nausea And Vomiting  . Oxycodone-Acetaminophen Itching    Current Outpatient Prescriptions  Medication Sig Dispense Refill  . aspirin 81 MG tablet Take 81 mg by mouth daily.      Marland Kitchen atorvastatin (LIPITOR) 20 MG tablet Take 1 tablet (20 mg total) by mouth daily.  30 tablet  5  . cholecalciferol (VITAMIN D) 1000 UNITS tablet Take 1,000 Units by mouth 3 (three) times daily.       . metoprolol succinate (TOPROL-XL) 25 MG 24 hr tablet take 1 tablet by mouth once daily  30 tablet  5  . Multiple Vitamin (MULTIVITAMIN) tablet Take 1 tablet by mouth daily. With calcium 200 mg and vitamin D 1000mg       . NITROSTAT 0.4 MG SL tablet place 1 tablet under the tongue every 5 minutes as directed by prescriber FOR UP TO 3 DOSES AS NEEDED FOR CHEST PAIN.  25 tablet  2   No current facility-administered medications for this visit.    Past Medical History  Diagnosis Date  . Retinal vein occlusion     Right (July 2008)  . CAD (coronary artery disease)     MI due to cardiac vessel dissection  . Hyperlipidemia   . Endometriosis 1989  . BCC (basal cell carcinoma of skin)     right leg, back    Past Surgical History  Procedure Laterality Date  . Cesarean section  2000, 2002    x2  . Total abdominal hysterectomy w/ bilateral salpingoophorectomy  12/08    ROS:  As stated in the HPI and negative for all other systems.  PHYSICAL EXAM BP 110/62  Pulse 49  Ht 5\' 8"  (1.727 m)  Wt 129 lb (58.514 kg)  BMI 19.62 kg/m2 GENERAL:  Well  appearing NECK:  No jugular venous distention, waveform within normal limits, carotid upstroke brisk and symmetric, no bruits, no thyromegaly LYMPHATICS:  No cervical, inguinal adenopathy LUNGS:  Clear to auscultation bilaterally HEART:  PMI not displaced or sustained,S1 and S2 within normal limits, no S3, no S4, no clicks, no rubs, no murmurs ABD:  Flat, positive bowel sounds normal in frequency in pitch, no bruits, no rebound, no guarding, no midline pulsatile mass, no hepatomegaly, no splenomegaly EXT:  2 plus pulses throughout, no edema, no cyanosis no clubbing  EKG:  Sinus rhythm, rate 49, axis within normal limits, intervals within normal limits, no acute ST-T wave changes.  10/17/2013  ASSESSMENT AND PLAN;  CAD (coronary artery disease) -  The patient has no new sypmtoms. No further cardiovascular testing is indicated. We will continue with aggressive risk reduction and meds as listed.  We again discussed specifics about her exercise.  Dyslipidemia -  I will check a lipid and liver.  If she is still having and LDL in the 30s I will reduce the Lipitor.

## 2013-10-17 NOTE — Patient Instructions (Signed)
Your physician recommends that you return for a FASTING lipid profile and hepatic panel today.  Your physician recommends that you continue on your current medications as directed. Please refer to the Current Medication list given to you today.  Your physician wants you to follow-up in: 1 year with Dr. Percival Spanish at [facility]. You will receive a reminder letter in the mail two months in advance. If you don't receive a letter, please call our office to schedule the follow-up appointment.

## 2013-10-25 ENCOUNTER — Telehealth: Payer: Self-pay | Admitting: Cardiology

## 2013-10-25 MED ORDER — ATORVASTATIN CALCIUM 20 MG PO TABS: 10.0000 mg | ORAL_TABLET | Freq: Every day | ORAL | Status: DC

## 2013-10-25 NOTE — Telephone Encounter (Signed)
Pt is aware of lab results and MD's recommendations. She will decrease the Lipitor to 10 mg once daily.

## 2013-10-25 NOTE — Telephone Encounter (Signed)
New message     Returning Pam's call from yesterday

## 2013-10-28 ENCOUNTER — Encounter: Payer: Self-pay | Admitting: Nurse Practitioner

## 2013-10-28 ENCOUNTER — Ambulatory Visit (INDEPENDENT_AMBULATORY_CARE_PROVIDER_SITE_OTHER): Payer: BC Managed Care – PPO | Admitting: Nurse Practitioner

## 2013-10-28 ENCOUNTER — Ambulatory Visit
Admission: RE | Admit: 2013-10-28 | Discharge: 2013-10-28 | Disposition: A | Discharge status: Home / Self Care | Payer: BC Managed Care – PPO | Source: Ambulatory Visit

## 2013-10-28 VITALS — BP 108/66 | HR 60 | Ht 67.75 in | Wt 131.0 lb

## 2013-10-28 DIAGNOSIS — Z0000 Encounter for general adult medical examination without abnormal findings: Secondary | ICD-10-CM

## 2013-10-28 DIAGNOSIS — Z1231 Encounter for screening mammogram for malignant neoplasm of breast: Secondary | ICD-10-CM

## 2013-10-28 DIAGNOSIS — Z01419 Encounter for gynecological examination (general) (routine) without abnormal findings: Secondary | ICD-10-CM

## 2013-10-28 DIAGNOSIS — Z1211 Encounter for screening for malignant neoplasm of colon: Secondary | ICD-10-CM

## 2013-10-28 LAB — POCT URINALYSIS DIPSTICK
Bilirubin, UA: NEGATIVE
Blood, UA: NEGATIVE
Glucose, UA: NEGATIVE
Ketones, UA: NEGATIVE
Leukocytes, UA: NEGATIVE
Nitrite, UA: NEGATIVE
Protein, UA: NEGATIVE
Urobilinogen, UA: NEGATIVE
pH, UA: 8

## 2013-10-28 LAB — HEMOGLOBIN, FINGERSTICK: Hemoglobin, fingerstick: 14.4 g/dL (ref 12.0–16.0)

## 2013-10-28 NOTE — Patient Instructions (Signed)

## 2013-10-28 NOTE — Progress Notes (Signed)
Patient ID: Kaylee Banks, female   DOB: [birth year], 52 y.o.   MRN: [id number] 53 y.o. [id number] Married Caucasian Fe here for annual exam.  Patient had MI at age 5. She is doing well now.  No LMP recorded. Patient has had a hysterectomy.          Sexually active: yes  The current method of family planning is status post hysterectomy.    Exercising: yes  Home exercise routine includes fast paced walking for 45 minutes everyday. Smoker:  no  Health Maintenance: Pap: 04/08/07. Hyst MMG: 10/28/2013, no results at time of visit Colonoscopy: 2010, Dr. Olevia Perches, follow-up 10 years  BMD: 10/29/09, normal  TDaP: 05/10/07  Labs:  HB:   Urine:  Negative    reports that she has never smoked. She has never used smokeless tobacco. She reports that she does not drink alcohol or use illicit drugs.  Past Medical History  Diagnosis Date  . Retinal vein occlusion     Right (July 2008)  . CAD (coronary artery disease)     MI due to cardiac vessel dissection  . Hyperlipidemia   . Endometriosis 1989  . BCC (basal cell carcinoma of skin)     right leg, back    Past Surgical History  Procedure Laterality Date  . Cesarean section  2000, 2002    x2  . Total abdominal hysterectomy w/ bilateral salpingoophorectomy  12/08    Current Outpatient Prescriptions  Medication Sig Dispense Refill  . aspirin 81 MG tablet Take 81 mg by mouth daily.      Marland Kitchen atorvastatin (LIPITOR) 20 MG tablet Take 0.5 tablets (10 mg total) by mouth daily at 6 PM.  30 tablet  5  . cholecalciferol (VITAMIN D) 1000 UNITS tablet Take 1,000 Units by mouth 3 (three) times daily.       . metoprolol succinate (TOPROL-XL) 25 MG 24 hr tablet take 1 tablet by mouth once daily  30 tablet  5  . Multiple Vitamin (MULTIVITAMIN) tablet Take 1 tablet by mouth daily. With calcium 200 mg and vitamin D 1000mg       . NITROSTAT 0.4 MG SL tablet place 1 tablet under the tongue every 5 minutes as directed by prescriber FOR UP TO 3 DOSES AS NEEDED FOR  CHEST PAIN.  25 tablet  2   No current facility-administered medications for this visit.    Family History  Problem Relation Age of Onset  . Hyperlipidemia Father 54  . Hyperlipidemia Mother 70  . Hypertension Mother 51  . Heart disease Maternal Grandmother 63    Multiple MIs  . Multiple births      Mat. G-aunt, quadruplets  . Diabetes Maternal Aunt   . Cancer - Lung      grandfather  . Cancer      abdominal, grandfather    ROS:  Pertinent items are noted in HPI.  Otherwise, a comprehensive ROS was negative.  Exam:   BP 108/66  Pulse 60  Ht 5' 7.75" (1.721 m)  Wt 131 lb (59.421 kg)  BMI 20.06 kg/m2 Height: 5' 7.75" (172.1 cm)  Ht Readings from Last 3 Encounters:  10/28/13 5' 7.75" (1.721 m)  10/17/13 5\' 8"  (1.727 m)  10/22/12 5\' 8"  (1.727 m)    General appearance: alert, cooperative and appears stated age Head: Normocephalic, without obvious abnormality, atraumatic Neck: no adenopathy, supple, symmetrical, trachea midline and thyroid normal to inspection and palpation Lungs: clear to auscultation bilaterally Breasts: normal appearance, no masses or tenderness  Heart: regular rate and rhythm Abdomen: soft, non-tender; no masses,  no organomegaly Extremities: extremities normal, atraumatic, no cyanosis or edema Skin: Skin color, texture, turgor normal. No rashes or lesions Lymph nodes: Cervical, supraclavicular, and axillary nodes normal. No abnormal inguinal nodes palpated Neurologic: Grossly normal   Pelvic: External genitalia:  no lesions              Urethra:  normal appearing urethra with no masses, tenderness or lesions              Bartholin's and Skene's: normal                 Vagina: normal appearing vagina with normal color and discharge, no lesions              Cervix: absent              Pap taken: no Bimanual Exam:  Uterus:  uterus absent              Adnexa: no mass, fullness, tenderness               Rectovaginal: Confirms               Anus:   normal sphincter tone, no lesions  A:  Well Woman with normal exam  Postmenopausal no ERT  S/P TAH/ BSO 05/2007 adenomyosis  History of CAD with MI 01/2012  P:   Reviewed health and wellness pertinent to exam  Pap smear not taken today  Mammogram is due 10/2014  IFOB is given today  Counseled on breast self exam, mammography screening, adequate intake of calcium and vitamin D, diet and exercise return annually or prn  An After Visit Summary was printed and given to the patient.

## 2013-11-01 ENCOUNTER — Ambulatory Visit: Payer: BC Managed Care – PPO | Admitting: Obstetrics & Gynecology

## 2013-11-04 NOTE — Progress Notes (Signed)
Encounter reviewed by Dr. Brook Silva.  

## 2013-11-14 ENCOUNTER — Other Ambulatory Visit: Payer: Self-pay | Admitting: Cardiology

## 2013-11-15 LAB — FECAL OCCULT BLOOD, IMMUNOCHEMICAL: IFOBT: NEGATIVE

## 2013-11-15 NOTE — Addendum Note (Signed)
Addended by: Robley Fries on: 11/15/2013 11:15 AM   Modules accepted: Orders

## 2014-02-09 ENCOUNTER — Encounter: Payer: Self-pay | Admitting: Internal Medicine

## 2014-02-10 ENCOUNTER — Ambulatory Visit: Payer: BC Managed Care – PPO | Admitting: Obstetrics & Gynecology

## 2014-04-10 ENCOUNTER — Encounter: Payer: Self-pay | Admitting: Nurse Practitioner

## 2014-05-10 ENCOUNTER — Other Ambulatory Visit: Payer: Self-pay | Admitting: Cardiology

## 2014-05-10 NOTE — Telephone Encounter (Signed)
Rx has been sent to the pharmacy electronically. ° °

## 2014-05-18 ENCOUNTER — Encounter (HOSPITAL_COMMUNITY): Payer: Self-pay | Admitting: Cardiovascular Disease

## 2014-07-27 ENCOUNTER — Other Ambulatory Visit: Payer: Self-pay

## 2014-07-27 DIAGNOSIS — Z1231 Encounter for screening mammogram for malignant neoplasm of breast: Secondary | ICD-10-CM

## 2014-10-10 ENCOUNTER — Telehealth: Payer: Self-pay | Admitting: Cardiology

## 2014-10-10 NOTE — Telephone Encounter (Signed)
Closed encounter °

## 2014-10-20 ENCOUNTER — Ambulatory Visit (INDEPENDENT_AMBULATORY_CARE_PROVIDER_SITE_OTHER): Payer: BLUE CROSS/BLUE SHIELD | Admitting: Cardiology

## 2014-10-20 ENCOUNTER — Ambulatory Visit: Payer: Self-pay | Admitting: Cardiology

## 2014-10-20 VITALS — BP 106/70 | HR 58 | Ht 68.0 in | Wt 145.0 lb

## 2014-10-20 DIAGNOSIS — I2583 Coronary atherosclerosis due to lipid rich plaque: Principal | ICD-10-CM

## 2014-10-20 DIAGNOSIS — I2510 Atherosclerotic heart disease of native coronary artery without angina pectoris: Secondary | ICD-10-CM

## 2014-10-20 MED ORDER — METOPROLOL SUCCINATE ER 25 MG PO TB24: 25.0000 mg | ORAL_TABLET | Freq: Every morning | ORAL | Status: DC

## 2014-10-20 MED ORDER — NITROGLYCERIN 0.4 MG SL SUBL: 0.4000 mg | SUBLINGUAL_TABLET | SUBLINGUAL | Status: DC | PRN

## 2014-10-20 MED ORDER — PRAVASTATIN SODIUM 20 MG PO TABS: 20.0000 mg | ORAL_TABLET | Freq: Every evening | ORAL | Status: DC

## 2014-10-20 NOTE — Patient Instructions (Signed)
Your physician recommends that you schedule a follow-up appointment in: one year with Dr. Percival Spanish   We have ordered a lipid panel for you to get done in 8 weeks  We have changed your lipitor to pravastatin 20 mg daily

## 2014-10-20 NOTE — Progress Notes (Signed)
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  She has had one episode of chest discomfort since I saw her previously and took1 NTG since I last saw her.  She has been active although not exercising right now.  With her level of activity she denies any chest pressure, neck or arm discomfort. She has not had any palpitations, presyncope or syncope. She denies any PND or orthopnea. She has had no weight gain or edema.  Allergies  Allergen Reactions  . Hydromorphone Hcl Nausea And Vomiting  . Oxycodone-Acetaminophen Itching    Current Outpatient Prescriptions  Medication Sig Dispense Refill  . aspirin 81 MG tablet Take 81 mg by mouth daily.    Marland Kitchen atorvastatin (LIPITOR) 10 MG tablet Take 10 mg by mouth daily.    . cholecalciferol (VITAMIN D) 1000 UNITS tablet Take 1,000 Units by mouth 3 (three) times daily.     . metoprolol succinate (TOPROL-XL) 25 MG 24 hr tablet take 1 tablet by mouth every morning 30 tablet 5  . Multiple Vitamin (MULTIVITAMIN) tablet Take 1 tablet by mouth daily. With calcium 200 mg and vitamin D 1000mg     . NITROSTAT 0.4 MG SL tablet place 1 tablet under the tongue every 5 minutes as directed by prescriber FOR UP TO 3 DOSES AS NEEDED FOR CHEST PAIN. 25 tablet 2   No current facility-administered medications for this visit.    Past Medical History  Diagnosis Date  . Retinal vein occlusion     Right (July 2008)  . 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 Hyperlipidemia   . Endometriosis 1989  . BCC (basal cell carcinoma of skin)     right leg, back    Past Surgical History  Procedure Laterality Date  . Cesarean section  2000, 2002    x2  . Total abdominal hysterectomy w/ bilateral salpingoophorectomy  12/08  . Left heart cath N/A 08/25/2011    Procedure: LEFT HEART CATH;  Surgeon: Sherren Mocha, MD;  Location: [facility] CATH LAB;  Service: Cardiovascular;  Laterality: N/A;    ROS:  As stated in the  HPI and negative for all other systems.  PHYSICAL EXAM BP 106/70 mmHg  Pulse 58  Ht 5\' 8"  (1.727 m)  Wt 145 lb (65.772 kg)  BMI 22.05 kg/m2 GENERAL:  Well appearing NECK:  No jugular venous distention, waveform within normal limits, carotid upstroke brisk and symmetric, no bruits, no thyromegaly LYMPHATICS:  No cervical, inguinal adenopathy LUNGS:  Clear to auscultation bilaterally HEART:  PMI not displaced or sustained,S1 and S2 within normal limits, no S3, no S4, no clicks, no rubs, no murmurs ABD:  Flat, positive bowel sounds normal in frequency in pitch, no bruits, no rebound, no guarding, no midline pulsatile mass, no hepatomegaly, no splenomegaly EXT:  2 plus pulses throughout, no edema, no cyanosis no clubbing  EKG:  Sinus rhythm, rate 58, axis within normal limits, intervals within normal limits, no acute ST-T wave changes.  10/20/2014  ASSESSMENT AND PLAN;  CAD (coronary artery disease) -  The patient has no new sypmtoms. No further cardiovascular testing is indicated. We will continue with aggressive risk reduction and meds as listed.  We again discussed specifics about her exercise.  Dyslipidemia -  Since her lipid profile has been very low and she is complaining of joint pain, I will stop Lipitor and start Pravchol 20 mg daily.  She will bet a lipid profile  in 8 weeks.

## 2014-10-21 ENCOUNTER — Encounter: Payer: Self-pay | Admitting: Cardiology

## 2014-10-27 ENCOUNTER — Telehealth: Payer: Self-pay | Admitting: Cardiology

## 2014-10-27 MED ORDER — ATORVASTATIN CALCIUM 10 MG PO TABS: ORAL_TABLET | ORAL | Status: DC

## 2014-10-27 MED ORDER — COQ10 30 MG PO CAPS: 30.0000 | ORAL_CAPSULE | Freq: Every day | ORAL | Status: DC

## 2014-10-27 NOTE — Telephone Encounter (Signed)
Returned call to patient Dr.Hochrein advised ok to take Lipitor 5 mg daily and take COQ 10 OTC daily.Advised to call back if she has any problems.

## 2014-10-27 NOTE — Telephone Encounter (Signed)
OK to take Lipitor 5 mg daily and take the CoQ OTC.

## 2014-10-27 NOTE — Telephone Encounter (Signed)
Pt was switched to Pravastatin a week ago. She does not like the side effects she is having.Please call to advise.

## 2014-10-27 NOTE — Telephone Encounter (Signed)
Returned call to patient she stated she cannot take Pravastatin.Stated she has been taking 20 mg daily for 1 week and is having headaches,dizzy,memory problem.Stated she wanted to ask Dr.Hochrein if she could go back to lipitor at a lower dose and could she take CoQ10 with lipitor.Stated she use to take lipitor 10 mg daily and had muscle pain.  Message sent to [place] for advice.

## 2014-10-30 ENCOUNTER — Ambulatory Visit: Payer: Self-pay

## 2014-10-30 ENCOUNTER — Ambulatory Visit
Admission: RE | Admit: 2014-10-30 | Discharge: 2014-10-30 | Disposition: A | Discharge status: Home / Self Care | Payer: BLUE CROSS/BLUE SHIELD | Source: Ambulatory Visit

## 2014-10-30 DIAGNOSIS — Z1231 Encounter for screening mammogram for malignant neoplasm of breast: Secondary | ICD-10-CM

## 2014-11-07 ENCOUNTER — Ambulatory Visit (INDEPENDENT_AMBULATORY_CARE_PROVIDER_SITE_OTHER): Payer: BLUE CROSS/BLUE SHIELD | Admitting: Obstetrics & Gynecology

## 2014-11-07 ENCOUNTER — Encounter: Payer: Self-pay | Admitting: Obstetrics & Gynecology

## 2014-11-07 VITALS — BP 108/72 | HR 60 | Resp 16 | Ht 67.75 in | Wt 143.0 lb

## 2014-11-07 DIAGNOSIS — Z0000 Encounter for general adult medical examination without abnormal findings: Secondary | ICD-10-CM

## 2014-11-07 DIAGNOSIS — Z01419 Encounter for gynecological examination (general) (routine) without abnormal findings: Secondary | ICD-10-CM

## 2014-11-07 LAB — COMPREHENSIVE METABOLIC PANEL
ALT: 22 U/L (ref 0–35)
AST: 23 U/L (ref 0–37)
Albumin: 4.5 g/dL (ref 3.5–5.2)
Alkaline Phosphatase: 74 U/L (ref 39–117)
BUN: 20 mg/dL (ref 6–23)
CO2: 30 mEq/L (ref 19–32)
Calcium: 9.6 mg/dL (ref 8.4–10.5)
Chloride: 105 mEq/L (ref 96–112)
Creat: 0.64 mg/dL (ref 0.50–1.10)
Glucose, Bld: 79 mg/dL (ref 70–99)
Potassium: 4.1 mEq/L (ref 3.5–5.3)
Sodium: 144 mEq/L (ref 135–145)
Total Bilirubin: 0.7 mg/dL (ref 0.2–1.2)
Total Protein: 6.8 g/dL (ref 6.0–8.3)

## 2014-11-07 LAB — POCT URINALYSIS DIPSTICK
Bilirubin, UA: NEGATIVE
Blood, UA: NEGATIVE
Glucose, UA: NEGATIVE
Ketones, UA: NEGATIVE
Leukocytes, UA: NEGATIVE
Nitrite, UA: NEGATIVE
Protein, UA: NEGATIVE
Urobilinogen, UA: NEGATIVE
pH, UA: 7

## 2014-11-07 LAB — TSH: TSH: 1.978 u[IU]/mL (ref 0.350–4.500)

## 2014-11-07 NOTE — Progress Notes (Addendum)
54 y.o. [id number] MarriedCaucasianF here for annual exam.  Doing well.  No vaginal bleeding.  Reports she is having blood work in six weeks for cholesterol.    Patient's last menstrual period was 05/10/2007.          Sexually active: Yes.    The current method of family planning is status post hysterectomy.    Exercising: Yes.    somewhat regularly-fast paced walking and hand weights Smoker:  no  Health Maintenance: Pap:  04/08/07-WNL History of abnormal Pap:  no MMG:  10/30/14 3D-normal Colonoscopy:  2010-repeat in 10 years Dr Olevia Perches BMD:   10/29/09-normal TDaP:  05/10/07  Screening Labs: today, Hb today: 14.5, Urine today: PH-7.0   reports that she has never smoked. She has never used smokeless tobacco. She reports that she does not drink alcohol or use illicit drugs.  Past Medical History  Diagnosis Date  . Retinal vein occlusion     Right (July 2008)  . 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 Hyperlipidemia   . Endometriosis 1989  . BCC (basal cell carcinoma of skin)     right leg, back    Past Surgical History  Procedure Laterality Date  . Cesarean section  2000, 2002    x2  . Total abdominal hysterectomy w/ bilateral salpingoophorectomy  12/08  . Left heart cath N/A 08/25/2011    Procedure: LEFT HEART CATH;  Surgeon: Sherren Mocha, MD;  Location: [facility] CATH LAB;  Service: Cardiovascular;  Laterality: N/A;    Current Outpatient Prescriptions  Medication Sig Dispense Refill  . aspirin 81 MG tablet Take 81 mg by mouth daily.    Marland Kitchen atorvastatin (LIPITOR) 10 MG tablet Take 1/2 tablet 5 mg daily 30 tablet 6  . cholecalciferol (VITAMIN D) 1000 UNITS tablet Take 1,000 Units by mouth 3 (three) times daily.     . Coenzyme Q10 (COQ10) 30 MG CAPS Take 30 capsules by mouth daily. 30 each 6  . metoprolol succinate (TOPROL-XL) 25 MG 24 hr tablet Take 1 tablet (25 mg total) by mouth every morning. 30 tablet 5  . Multiple Vitamin (MULTIVITAMIN) tablet  Take 1 tablet by mouth daily. With calcium 200 mg and vitamin D 1000mg     . nitroGLYCERIN (NITROSTAT) 0.4 MG SL tablet Place 1 tablet (0.4 mg total) under the tongue every 5 (five) minutes as needed for chest pain. 25 tablet 2   No current facility-administered medications for this visit.    Family History  Problem Relation Age of Onset  . Hyperlipidemia Father 79  . Hyperlipidemia Mother 46  . Hypertension Mother 41  . Heart disease Maternal Grandmother 1    Multiple MIs  . Multiple births      Mat. G-aunt, quadruplets  . Diabetes Maternal Aunt   . Cancer - Lung      grandfather  . Cancer      abdominal, grandfather    ROS:  Pertinent items are noted in HPI.  Otherwise, a comprehensive ROS was negative.  Exam:   General appearance: alert, cooperative and appears stated age Head: Normocephalic, without obvious abnormality, atraumatic Neck: no adenopathy, supple, symmetrical, trachea midline and thyroid normal to inspection and palpation Lungs: clear to auscultation bilaterally Breasts: normal appearance, no masses or tenderness Heart: regular rate and rhythm Abdomen: soft, non-tender; bowel sounds normal; no masses,  no organomegaly Extremities: extremities normal, atraumatic, no cyanosis or edema Skin: Skin color, texture, turgor normal. No rashes or lesions  Lymph nodes: Cervical, supraclavicular, and axillary nodes normal. No abnormal inguinal nodes palpated Neurologic: Grossly normal   Pelvic: External genitalia:  no lesions              Urethra:  normal appearing urethra with no masses, tenderness or lesions              Bartholins and Skenes: normal                 Vagina: normal appearing vagina with normal color and discharge, no lesions              Cervix: absent              Pap taken: No. 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 Percival Spanish [facility] (seeing derm yearly) H/O TAH/BSO  P: Mammogram yearly. D/W pt 3D MMG due to dense breasts No pap smear indicated due to H/O TAH/BSO Labs with cardiologist and PCP TSH, Vit d, CMP return annually or prn

## 2014-11-07 NOTE — Addendum Note (Signed)
Addended by: Megan Salon on: 11/07/2014 02:23 PM   Modules accepted: Miquel Dunn

## 2014-11-08 LAB — HEMOGLOBIN, FINGERSTICK: Hemoglobin, fingerstick: 14.5 g/dL (ref 12.0–16.0)

## 2014-11-08 LAB — VITAMIN D 25 HYDROXY (VIT D DEFICIENCY, FRACTURES): Vit D, 25-Hydroxy: 47 ng/mL (ref 30–100)

## 2015-02-14 LAB — HCV ANTIBODY

## 2015-05-13 ENCOUNTER — Other Ambulatory Visit: Payer: Self-pay | Admitting: Cardiology

## 2015-05-14 NOTE — Telephone Encounter (Signed)
REFILL 

## 2015-08-16 ENCOUNTER — Encounter: Payer: Self-pay | Admitting: Gastroenterology

## 2015-09-04 ENCOUNTER — Encounter: Payer: Self-pay | Admitting: Gastroenterology

## 2015-09-24 ENCOUNTER — Other Ambulatory Visit: Payer: Self-pay

## 2015-09-24 DIAGNOSIS — Z1231 Encounter for screening mammogram for malignant neoplasm of breast: Secondary | ICD-10-CM

## 2015-10-02 DIAGNOSIS — H349 Unspecified retinal vascular occlusion: Secondary | ICD-10-CM | POA: Diagnosis not present

## 2015-10-03 DIAGNOSIS — Z85828 Personal history of other malignant neoplasm of skin: Secondary | ICD-10-CM | POA: Diagnosis not present

## 2015-10-03 DIAGNOSIS — D2272 Melanocytic nevi of left lower limb, including hip: Secondary | ICD-10-CM | POA: Diagnosis not present

## 2015-10-03 DIAGNOSIS — L821 Other seborrheic keratosis: Secondary | ICD-10-CM | POA: Diagnosis not present

## 2015-10-03 DIAGNOSIS — D225 Melanocytic nevi of trunk: Secondary | ICD-10-CM | POA: Diagnosis not present

## 2015-10-23 DIAGNOSIS — H34831 Tributary (branch) retinal vein occlusion, right eye, with macular edema: Secondary | ICD-10-CM | POA: Diagnosis not present

## 2015-10-29 ENCOUNTER — Encounter: Payer: Self-pay | Admitting: *Deleted

## 2015-10-29 ENCOUNTER — Other Ambulatory Visit: Payer: Self-pay | Admitting: Cardiology

## 2015-10-29 DIAGNOSIS — I2510 Atherosclerotic heart disease of native coronary artery without angina pectoris: Secondary | ICD-10-CM | POA: Diagnosis not present

## 2015-10-29 LAB — LIPID PANEL
Cholesterol: 148 mg/dL (ref 125–200)
HDL: 52 mg/dL (ref >=46)
LDL Cholesterol: 71 mg/dL (ref <130)
Total CHOL/HDL Ratio: 2.8 Ratio (ref <=5.0)
Triglycerides: 125 mg/dL (ref <150)
VLDL: 25 mg/dL (ref <30)

## 2015-10-30 ENCOUNTER — Ambulatory Visit (INDEPENDENT_AMBULATORY_CARE_PROVIDER_SITE_OTHER): Payer: BLUE CROSS/BLUE SHIELD | Admitting: Cardiology

## 2015-10-30 ENCOUNTER — Encounter: Payer: Self-pay | Admitting: Cardiology

## 2015-10-30 VITALS — BP 118/78 | HR 55 | Ht 68.0 in | Wt 149.2 lb

## 2015-10-30 DIAGNOSIS — I2542 Coronary artery dissection: Secondary | ICD-10-CM | POA: Diagnosis not present

## 2015-10-30 DIAGNOSIS — E785 Hyperlipidemia, unspecified: Secondary | ICD-10-CM | POA: Diagnosis not present

## 2015-10-30 NOTE — Patient Instructions (Signed)
Medication Instructions:  Continue current medications  Labwork: NONE  Testing/Procedures: NONE  Follow-Up: Your physician wants you to follow-up in: 1 Year. You will receive a reminder letter in the mail two months in advance. If you don't receive a letter, please call our office to schedule the follow-up appointment.   Any Other Special Instructions Will Be Listed Below (If Applicable).   If you need a refill on your cardiac medications before your next appointment, please call your pharmacy.   

## 2015-10-30 NOTE — Progress Notes (Signed)
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  She has done very well and thinks she has only needed one NTG in the past year.  She is walking almost daily.  The patient denies any new symptoms such as chest discomfort, neck or arm discomfort. There has been no new shortness of breath, PND or orthopnea. There have been no reported palpitations, presyncope or syncope.   Allergies  Allergen Reactions  . Hydromorphone Hcl Nausea And Vomiting  . Oxycodone-Acetaminophen Itching    Current Outpatient Prescriptions  Medication Sig Dispense Refill  . aspirin 81 MG tablet Take 81 mg by mouth daily.    Marland Kitchen atorvastatin (LIPITOR) 10 MG tablet Take 1/2 tablet 5 mg daily 30 tablet 6  . cholecalciferol (VITAMIN D) 1000 UNITS tablet Take 1,000 Units by mouth 3 (three) times daily.     . Coenzyme Q10 (COQ10) 100 MG CAPS Take 100 mg by mouth daily.    . metoprolol succinate (TOPROL-XL) 25 MG 24 hr tablet take 1 tablet by mouth every morning 30 tablet 6  . Multiple Vitamin (MULTIVITAMIN) tablet Take 1 tablet by mouth daily. With calcium 200 mg and vitamin D 1000mg     . nitroGLYCERIN (NITROSTAT) 0.4 MG SL tablet Place 1 tablet (0.4 mg total) under the tongue every 5 (five) minutes as needed for chest pain. 25 tablet 2   No current facility-administered medications for this visit.    Past Medical History  Diagnosis Date  . Retinal vein occlusion     Right (July 2008)  . 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 Hyperlipidemia   . Endometriosis 1989  . BCC (basal cell carcinoma of skin)     right leg, back  . Colon polyp     Past Surgical History  Procedure Laterality Date  . Cesarean section  2000, 2002    x2  . Total abdominal hysterectomy w/ bilateral salpingoophorectomy  12/08  . Left heart cath N/A 08/25/2011    Procedure: LEFT HEART CATH;  Surgeon: Sherren Mocha, MD;  Location: [facility] CATH LAB;  Service: Cardiovascular;   Laterality: N/A;  . Appendectomy      ROS:  As stated in the HPI and negative for all other systems.  PHYSICAL EXAM BP 118/78 mmHg  Pulse 55  Ht 5\' 8"  (1.727 m)  Wt 149 lb 3.2 oz (67.677 kg)  BMI 22.69 kg/m2  LMP 05/10/2007 GENERAL:  Well appearing NECK:  No jugular venous distention, waveform within normal limits, carotid upstroke brisk and symmetric, no bruits, no thyromegaly LUNGS:  Clear to auscultation bilaterally HEART:  PMI not displaced or sustained,S1 and S2 within normal limits, no S3, no S4, no clicks, no rubs, no murmurs ABD:  Flat, positive bowel sounds normal in frequency in pitch, no bruits, no rebound, no guarding, no midline pulsatile mass, no hepatomegaly, no splenomegaly EXT:  2 plus pulses throughout, no edema, no cyanosis no clubbing  EKG:  Sinus rhythm, rate 55, axis within normal limits, intervals within normal limits, no acute ST-T wave changes.  10/30/2015   Lab Results  Component Value Date   CHOL 148 10/29/2015   TRIG 125 10/29/2015   HDL 52 10/29/2015   LDLCALC 71 10/29/2015    ASSESSMENT AND PLAN;  CAD (coronary artery disease) -  The patient has no new sypmtoms. No further cardiovascular testing is indicated. We will continue with aggressive risk reduction and meds as listed.  Dyslipidemia -  She will continue current meds.  No change in therapy.

## 2015-10-31 ENCOUNTER — Encounter: Payer: Self-pay | Admitting: Gastroenterology

## 2015-10-31 ENCOUNTER — Ambulatory Visit (INDEPENDENT_AMBULATORY_CARE_PROVIDER_SITE_OTHER): Payer: BLUE CROSS/BLUE SHIELD | Admitting: Gastroenterology

## 2015-10-31 VITALS — BP 110/78 | HR 52 | Ht 68.0 in | Wt 148.8 lb

## 2015-10-31 DIAGNOSIS — Z1211 Encounter for screening for malignant neoplasm of colon: Secondary | ICD-10-CM | POA: Diagnosis not present

## 2015-10-31 DIAGNOSIS — Z86010 Personal history of colonic polyps: Secondary | ICD-10-CM | POA: Diagnosis not present

## 2015-10-31 MED ORDER — NA SULFATE-K SULFATE-MG SULF 17.5-3.13-1.6 GM/177ML PO SOLN: ORAL | Status: DC

## 2015-10-31 NOTE — Progress Notes (Signed)
HPI :  55 y/o female here for a visit to establish her care with me, she has not been seen in 7 years, previously followed by Dr. Olevia Perches. She has no family history of colon cancer. She reported she had an adenomatous polyp removed at age 52 when living in [place], it was prolapsing out of her rectum which led her to seek evaluation. Since that time she has had surveillance exams. She has had one juvenile polyp in the mid 1990s. Since that time her colonoscopies have not showed any adenomas. She reports Dr. Olevia Perches told her she needed to have a repeat colonoscopy 7 years after her last exam.   No bowel havit changes. She does have hemorrhoidal bleeding, with wiping following bowel movements, no obvious blood on the stool. No perianal discomfort. No abdominal pains. No sibliings with polyps. Father had colon polyps.   She has a history of MI and on aspirin. She denies any exertional shortness of breath or chest pains, she has a good functional status.   Grandfather had small bowel leiomyosarcoma. Grandmother with uterine cancer.   Endoscopic history: Colonoscopy 1993 - benign rectal juvenile polyp Colonoscopy 1995 - rectal polyp removed, no path available Colonoscopy 1999 - normal Colonoscopy 2004 - normal Colonoscopy 2010 - normal  Past Medical History  Diagnosis Date  . Retinal vein occlusion     Right (July 2008)  . 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 Hyperlipidemia   . Endometriosis 1989  . BCC (basal cell carcinoma of skin)     right leg, back  . Colon polyp      Past Surgical History  Procedure Laterality Date  . Cesarean section  2000, 2002    x2  . Total abdominal hysterectomy w/ bilateral salpingoophorectomy  12/08  . Left heart cath N/A 08/25/2011    Procedure: LEFT HEART CATH;  Surgeon: Sherren Mocha, MD;  Location: [facility] CATH LAB;  Service: Cardiovascular;  Laterality: N/A;  . Appendectomy     Family History  Problem  Relation Age of Onset  . Hyperlipidemia Father 24  . Hyperlipidemia Mother 56  . Hypertension Mother 60  . Heart disease Maternal Grandmother 47    Multiple MIs  . Multiple births      Mat. G-aunt, quadruplets  . Diabetes Maternal Aunt   . Cancer - Lung      grandfather  . Stomach cancer Maternal Grandfather   . Colon polyps Father   . Colon polyps Paternal Grandfather   . Uterine cancer Paternal Grandmother    Social History  Substance Use Topics  . Smoking status: Never Smoker   . Smokeless tobacco: Never Used  . Alcohol Use: No   Current Outpatient Prescriptions  Medication Sig Dispense Refill  . aspirin 81 MG tablet Take 81 mg by mouth daily.    Marland Kitchen atorvastatin (LIPITOR) 10 MG tablet Take 1/2 tablet 5 mg daily 30 tablet 6  . cholecalciferol (VITAMIN D) 1000 UNITS tablet Take 1,000 Units by mouth 3 (three) times daily.     . Coenzyme Q10 (COQ10) 100 MG CAPS Take 100 mg by mouth daily.    . metoprolol succinate (TOPROL-XL) 25 MG 24 hr tablet take 1 tablet by mouth every morning 30 tablet 6  . Multiple Vitamin (MULTIVITAMIN) tablet Take 1 tablet by mouth daily. With calcium 200 mg and vitamin D 1000mg     . nitroGLYCERIN (NITROSTAT) 0.4 MG SL tablet Place 1 tablet (0.4  mg total) under the tongue every 5 (five) minutes as needed for chest pain. 25 tablet 2   No current facility-administered medications for this visit.   Allergies  Allergen Reactions  . Hydromorphone Hcl Nausea And Vomiting  . Oxycodone-Acetaminophen Itching  . Dilaudid [Hydromorphone Hcl]     sensitivity     Review of Systems: All systems reviewed and negative except where noted in HPI.   Lab Results  Component Value Date   WBC 6.1 08/26/2011   HGB 14.5 11/07/2014   HCT 35.3* 08/26/2011   MCV 88.7 08/26/2011   PLT 123* 08/26/2011    Lab Results  Component Value Date   ALT 22 11/07/2014   AST 23 11/07/2014   ALKPHOS 74 11/07/2014   BILITOT 0.7 11/07/2014     Physical Exam: BP 110/78 mmHg   Pulse 52  Ht 5\' 8"  (1.727 m)  Wt 148 lb 12.8 oz (67.495 kg)  BMI 22.63 kg/m2  LMP 05/10/2007 Constitutional: Pleasant,well-developed, female in no acute distress. HEENT: Normocephalic and atraumatic. Conjunctivae are normal. No scleral icterus. Neck supple.  Cardiovascular: Normal rate, regular rhythm.  Pulmonary/chest: Effort normal and breath sounds normal. No wheezing, rales or rhonchi. Abdominal: Soft, nondistended, nontender. Bowel sounds active throughout. There are no masses palpable. No hepatomegaly. Extremities: no edema Lymphadenopathy: No cervical adenopathy noted. Neurological: Alert and oriented to person place and time. Skin: Skin is warm and dry. No rashes noted. Psychiatric: Normal mood and affect. Behavior is normal.   ASSESSMENT AND PLAN: 55 y/o female with remote history of an adenoma removed at age 72, with a juvenile polyp removed in the early 1990s. Since that time no adenomas reportedly removed.  She has no alarm symptoms and no significant family history of colon cancer. While based on her last 3 colonoscopies dating back to 1999, she has not had any adenomatous polyps and per current ACG screening guidelines she would not be due for an additional 3 years (10 years from her last exam), she strongly preferred to have it done now for piece of mind as she is anxious about her remote history of adenoma as a teenager, and that Dr. Olevia Perches had recommended 7 year intervals for her previously.   The indications, risks, and benefits of colonoscopy were explained to the patient in detail. Risks include but are not limited to bleeding, perforation, adverse reaction to medications, and cardiopulmonary compromise. Sequelae include but are not limited to the possibility of surgery, hospitalization, and mortality. The patient verbalized understanding and wished to proceed. All questions answered, referred to the scheduler and bowel prep ordered. Further recommendations pending results  of the exam.   Lincoln Cellar, MD [facility] Pager [phone]

## 2015-10-31 NOTE — Patient Instructions (Signed)
You have been scheduled for a colonoscopy. Please follow written instructions given to you at your visit today.  Please pick up your prep supplies at the pharmacy within the next 1-3 days. If you use inhalers (even only as needed), please bring them with you on the day of your procedure. Your physician has requested that you go to www.startemmi.com and enter the access code given to you at your visit today. This web site gives a general overview about your procedure. However, you should still follow specific instructions given to you by our office regarding your preparation for the procedure.  If you are age 43 or older, your body mass index should be between 23-30. Your Body mass index is 22.63 kg/(m^2). If this is out of the aforementioned range listed, please consider follow up with your Primary Care Provider.  If you are age 45 or younger, your body mass index should be between 19-25. Your Body mass index is 22.63 kg/(m^2). If this is out of the aformentioned range listed, please consider follow up with your Primary Care Provider.

## 2015-11-02 ENCOUNTER — Ambulatory Visit
Admission: RE | Admit: 2015-11-02 | Discharge: 2015-11-02 | Disposition: A | Discharge status: Home / Self Care | Payer: BLUE CROSS/BLUE SHIELD | Source: Ambulatory Visit

## 2015-11-02 DIAGNOSIS — Z1231 Encounter for screening mammogram for malignant neoplasm of breast: Secondary | ICD-10-CM

## 2015-11-08 ENCOUNTER — Telehealth: Payer: Self-pay | Admitting: Gastroenterology

## 2015-11-08 NOTE — Telephone Encounter (Signed)
A user error has taken place.

## 2015-11-09 DIAGNOSIS — Z85828 Personal history of other malignant neoplasm of skin: Secondary | ICD-10-CM | POA: Diagnosis not present

## 2015-11-09 DIAGNOSIS — L814 Other melanin hyperpigmentation: Secondary | ICD-10-CM | POA: Diagnosis not present

## 2015-12-18 ENCOUNTER — Other Ambulatory Visit: Payer: Self-pay

## 2015-12-18 MED ORDER — METOPROLOL SUCCINATE ER 25 MG PO TB24: 25.0000 mg | ORAL_TABLET | Freq: Every morning | ORAL | Status: DC

## 2015-12-18 MED ORDER — ATORVASTATIN CALCIUM 10 MG PO TABS: ORAL_TABLET | ORAL | Status: DC

## 2016-01-03 ENCOUNTER — Encounter: Payer: BLUE CROSS/BLUE SHIELD | Admitting: Gastroenterology

## 2016-01-17 ENCOUNTER — Encounter: Payer: Self-pay | Admitting: Gastroenterology

## 2016-01-30 ENCOUNTER — Ambulatory Visit (AMBULATORY_SURGERY_CENTER): Payer: BLUE CROSS/BLUE SHIELD | Admitting: Gastroenterology

## 2016-01-30 ENCOUNTER — Encounter: Payer: Self-pay | Admitting: Gastroenterology

## 2016-01-30 VITALS — BP 122/78 | HR 58 | Temp 98.6°F | Resp 19 | Ht 68.0 in | Wt 148.0 lb

## 2016-01-30 DIAGNOSIS — D124 Benign neoplasm of descending colon: Secondary | ICD-10-CM | POA: Diagnosis not present

## 2016-01-30 DIAGNOSIS — D123 Benign neoplasm of transverse colon: Secondary | ICD-10-CM

## 2016-01-30 DIAGNOSIS — Z86010 Personal history of colonic polyps: Secondary | ICD-10-CM | POA: Diagnosis not present

## 2016-01-30 DIAGNOSIS — D125 Benign neoplasm of sigmoid colon: Secondary | ICD-10-CM | POA: Diagnosis not present

## 2016-01-30 MED ORDER — SODIUM CHLORIDE 0.9 % IV SOLN: 500.0000 mL | INTRAVENOUS | Status: DC

## 2016-01-30 NOTE — Progress Notes (Signed)
Called to room to assist during endoscopic procedure.  Patient ID and intended procedure confirmed with present staff. Received instructions for my participation in the procedure from the performing physician.  

## 2016-01-30 NOTE — Progress Notes (Signed)
A/ox3 pleased with MAC, report to Tracy RN 

## 2016-01-30 NOTE — Op Note (Signed)
[facility] Patient Name: Weston Loaiza Procedure Date: 01/30/2016 10:55 AM MRN: [id number] Endoscopist: Remo Lipps P. Havery [name] , MD Age: 55 Referring MD:  Date of Birth: 01-22-1961 Gender: Female Account #: [id number] Procedure:                Colonoscopy Indications:              Screening for malignant neoplasm in the colon Medicines:                Monitored Anesthesia Care Procedure:                Pre-Anesthesia Assessment:                           - Prior to the procedure, a History and Physical                            was performed, and patient medications and                            allergies were reviewed. The patient's tolerance of                            previous anesthesia was also reviewed. The risks                            and benefits of the procedure and the sedation                            options and risks were discussed with the patient.                            All questions were answered, and informed consent                            was obtained. Prior Anticoagulants: The patient has                            taken aspirin, last dose was 1 day prior to                            procedure. ASA Grade Assessment: II - A patient                            with mild systemic disease. After reviewing the                            risks and benefits, the patient was deemed in                            satisfactory condition to undergo the procedure.                           After obtaining informed consent, the colonoscope  was passed under direct vision. Throughout the                            procedure, the patient's blood pressure, pulse, and                            oxygen saturations were monitored continuously. The                            Model PCF-H190L [id number]) scope was introduced                            through the anus and advanced to the the cecum,   identified by appendiceal orifice and ileocecal                            valve. The colonoscopy was performed without                            difficulty. The patient tolerated the procedure                            well. The quality of the bowel preparation was                            adequate. The ileocecal valve, appendiceal orifice,                            and rectum were photographed. Scope In: 11:00:01 AM Scope Out: 11:23:05 AM Scope Withdrawal Time: 0 hours 12 minutes 57 seconds  Total Procedure Duration: 0 hours 23 minutes 4 seconds  Findings:                 The perianal and digital rectal examinations were                            normal.                           A 5-6 mm polyp was found in the transverse colon.                            The polyp was sessile. The polyp was removed with a                            cold snare. Resection and retrieval were complete.                           A 5 mm polyp was found in the descending colon. The                            polyp was sessile. The polyp was removed with a  cold snare. Resection and retrieval were complete.                           A 4 mm polyp was found in the sigmoid colon. The                            polyp was sessile. The polyp was removed with a                            cold snare. Resection and retrieval were complete.                           Anal papilla(e) were hypertrophied.                           The exam was otherwise normal throughout the                            examined colon. Complications:            No immediate complications. Estimated blood loss:                            Minimal. Estimated Blood Loss:     Estimated blood loss was minimal. Impression:               - One 5-6 mm polyp in the transverse colon, removed                            with a cold snare. Resected and retrieved.                           - One 5 mm polyp in the descending  colon, removed                            with a cold snare. Resected and retrieved.                           - One 4 mm polyp in the sigmoid colon, removed with                            a cold snare. Resected and retrieved.                           - Anal papilla(e) were hypertrophied. Recommendation:           - Patient has a contact number available for                            emergencies. The signs and symptoms of potential                            delayed complications were discussed with the  patient. Return to normal activities tomorrow.                            Written discharge instructions were provided to the                            patient.                           - Resume previous diet.                           - Continue present medications.                           - No aspirin, ibuprofen, naproxen, or other                            non-steroidal anti-inflammatory drugs for 2 weeks                            after polyp removal.                           - Await pathology results.                           - Repeat colonoscopy is recommended for                            surveillance. The colonoscopy date will be                            determined after pathology results from today's                            exam become available for review. Remo Lipps P. Gracen Southwell, MD 01/30/2016 11:27:28 AM This report has been signed electronically.

## 2016-01-30 NOTE — Patient Instructions (Signed)
Impression/recommendations:  Polyps (handout given)  No aspirin, aspirin containing products, ibuprofen, naproxen or other anti-inflammatory medications for 2 weeks. Tylenol only if needed until 02/14/16.  YOU HAD AN ENDOSCOPIC PROCEDURE TODAY AT [facility]:   Refer to the procedure report that was given to you for any specific questions about what was found during the examination.  If the procedure report does not answer your questions, please call your gastroenterologist to clarify.  If you requested that your care partner not be given the details of your procedure findings, then the procedure report has been included in a sealed envelope for you to review at your convenience later.  YOU SHOULD EXPECT: Some feelings of bloating in the abdomen. Passage of more gas than usual.  Walking can help get rid of the air that was put into your GI tract during the procedure and reduce the bloating. If you had a lower endoscopy (such as a colonoscopy or flexible sigmoidoscopy) you may notice spotting of blood in your stool or on the toilet paper. If you underwent a bowel prep for your procedure, you may not have a normal bowel movement for a few days.  Please Note:  You might notice some irritation and congestion in your nose or some drainage.  This is from the oxygen used during your procedure.  There is no need for concern and it should clear up in a day or so.  SYMPTOMS TO REPORT IMMEDIATELY:   Following lower endoscopy (colonoscopy or flexible sigmoidoscopy):  Excessive amounts of blood in the stool  Significant tenderness or worsening of abdominal pains  Swelling of the abdomen that is new, acute  Fever of 100F or higher   For urgent or emergent issues, a gastroenterologist can be reached at any hour by calling [phone].   DIET:  We do recommend a small meal at first, but then you may proceed to your regular diet.  Drink plenty of fluids but you should avoid alcoholic  beverages for 24 hours.  ACTIVITY:  You should plan to take it easy for the rest of today and you should NOT DRIVE or use heavy machinery until tomorrow (because of the sedation medicines used during the test).    FOLLOW UP: Our staff will call the number listed on your records the next business day following your procedure to check on you and address any questions or concerns that you may have regarding the information given to you following your procedure. If we do not reach you, we will leave a message.  However, if you are feeling well and you are not experiencing any problems, there is no need to return our call.  We will assume that you have returned to your regular daily activities without incident.  If any biopsies were taken you will be contacted by phone or by letter within the next 1-3 weeks.  Please call us at [phone] if you have not heard about the biopsies in 3 weeks.    SIGNATURES/CONFIDENTIALITY: You and/or your care partner have signed paperwork which will be entered into your electronic medical record.  These signatures attest to the fact that that the information above on your After Visit Summary has been reviewed and is understood.  Full responsibility of the confidentiality of this discharge information lies with you and/or your care-partner.

## 2016-01-31 ENCOUNTER — Telehealth: Payer: Self-pay

## 2016-01-31 NOTE — Telephone Encounter (Signed)
  Follow up Call-  Call back number 01/30/2016  Post procedure Call Back phone  # [phone]  Permission to leave phone message Yes  Some recent data might be hidden     Patient questions:  Do you have a fever, pain , or abdominal swelling? No. Pain Score  0 *  Have you tolerated food without any problems? Yes.    Have you been able to return to your normal activities? Yes.    Do you have any questions about your discharge instructions: Diet   No. Medications  No. Follow up visit  No.  Do you have questions or concerns about your Care? No.  Actions: * If pain score is 4 or above: No action needed, pain <4.

## 2016-02-01 ENCOUNTER — Ambulatory Visit (INDEPENDENT_AMBULATORY_CARE_PROVIDER_SITE_OTHER): Payer: BLUE CROSS/BLUE SHIELD | Admitting: Obstetrics & Gynecology

## 2016-02-01 ENCOUNTER — Encounter: Payer: Self-pay | Admitting: Obstetrics & Gynecology

## 2016-02-01 DIAGNOSIS — Z01419 Encounter for gynecological examination (general) (routine) without abnormal findings: Secondary | ICD-10-CM | POA: Diagnosis not present

## 2016-02-01 NOTE — Progress Notes (Signed)
55 y.o. [id number] MarriedCaucasianF here for annual exam.  No vaginal bleeding.  Had colonoscopy this week.  Had three polyps removed this week.  Still waiting on pathology.  This was Dr. Havery [name].  Having vaginal dryness.  Had used olive oil.  Does want to talk about options.    PCP:  Dr. Kenton Kingfisher.  Last visit was last fall.  Blood work was done then.    Patient's last menstrual period was 05/10/2007.          Sexually active: Yes.    The current method of family planning is status post hysterectomy.    Exercising: No.  The patient does not participate in regular exercise at present. Smoker:  no  Health Maintenance: Pap:  04/08/07 Neg History of abnormal Pap:  no MMG:  11/06/15 BIRADS1:Neg  Colonoscopy:  01/30/16 polyps  BMD:   10/29/09 Normal  TDaP:  05/2007 Pneumonia vaccine(s):  No Zostavax:   No Hep C testing: neg 2016 Screening Labs: PCP, Urine today: PCP   reports that she has never smoked. She has never used smokeless tobacco. She reports that she does not drink alcohol or use drugs.  Past Medical History:  Diagnosis Date  . BCC (basal cell carcinoma of skin)    right leg, back  . 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 Colon polyp   . Endometriosis 1989  . Hyperlipidemia   . Hypertension   . Myocardial infarction ([place])    2013  . Retinal vein occlusion    Right (July 2008)    Past Surgical History:  Procedure Laterality Date  . APPENDECTOMY    . CESAREAN SECTION  2000, 2002   x2  . COLONOSCOPY    . LEFT HEART CATH N/A 08/25/2011   Procedure: LEFT HEART CATH;  Surgeon: Sherren Mocha, MD;  Location: [facility] C[facility] LAB;  Service: Cardiovascular;  Laterality: N/A;  . TOTAL ABDOMINAL HYSTERECTOMY W/ BILATERAL SALPINGOOPHORECTOMY  12/08    Current Outpatient Prescriptions  Medication Sig Dispense Refill  . aspirin 81 MG tablet Take 81 mg by mouth daily.    Marland Kitchen atorvastatin (LIPITOR) 10 MG tablet Take 1/2 tablet 5 mg daily 30 tablet 6   . cholecalciferol (VITAMIN D) 1000 UNITS tablet Take 1,000 Units by mouth 3 (three) times daily.     . Coenzyme Q10 (COQ10) 100 MG CAPS Take 100 mg by mouth daily.    . metoprolol succinate (TOPROL-XL) 25 MG 24 hr tablet Take 1 tablet (25 mg total) by mouth every morning. 30 tablet 6  . Multiple Vitamin (MULTIVITAMIN) tablet Take 1 tablet by mouth daily. With calcium 200 mg and vitamin D 1000mg     . nitroGLYCERIN (NITROSTAT) 0.4 MG SL tablet Place 1 tablet (0.4 mg total) under the tongue every 5 (five) minutes as needed for chest pain. (Patient not taking: Reported on 01/30/2016) 25 tablet 2   No current facility-administered medications for this visit.     Family History  Problem Relation Age of Onset  . Hyperlipidemia Father 48  . Colon polyps Father   . Hyperlipidemia Mother 61  . Hypertension Mother 41  . Heart disease Maternal Grandmother 87    Multiple MIs  . Multiple births      Mat. G-aunt, quadruplets  . Cancer - Lung      grandfather  . Diabetes Maternal Aunt   . Stomach cancer Maternal Grandfather   . Colon polyps Paternal Grandfather   . Uterine cancer Paternal  Grandmother   . Colon cancer Neg Hx     ROS:  Pertinent items are noted in HPI.  Otherwise, a comprehensive ROS was negative.  Exam:   LMP 05/10/2007   Weight change: @WEIGHTCHANGE @ Height:      Ht Readings from Last 3 Encounters:  01/30/16 5\' 8"  (1.727 m)  10/31/15 5\' 8"  (1.727 m)  10/30/15 5\' 8"  (1.727 m)    General appearance: alert, cooperative and appears stated age Head: Normocephalic, without obvious abnormality, atraumatic Neck: no adenopathy, supple, symmetrical, trachea midline and thyroid normal to inspection and palpation Lungs: clear to auscultation bilaterally Breasts: normal appearance, no masses or tenderness Heart: regular rate and rhythm Abdomen: soft, non-tender; bowel sounds normal; no masses,  no organomegaly Extremities: extremities normal, atraumatic, no cyanosis or edema Skin:  Skin color, texture, turgor normal. No rashes or lesions Lymph nodes: Cervical, supraclavicular, and axillary nodes normal. No abnormal inguinal nodes palpated Neurologic: Grossly normal  Pelvic: External genitalia:  no lesions              Urethra:  normal appearing urethra with no masses, tenderness or lesions              Bartholins and Skenes: normal                 Vagina: normal appearing vagina with normal color and discharge, no lesions              Cervix: absent              Pap taken: No. Bimanual Exam:  Uterus:  uterus absent              Adnexa: no mass, fullness, tenderness               Rectovaginal: Confirms               Anus:  normal sphincter tone, no lesions  Chaperone was present for exam.  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 Percival [facility] (seeing derm yearly) H/O TAH/BSO Grade D breast density Vaginal atrophic changes  P: Mammogram yearly. D/W pt 3D MMG due to Grade D dense breasts No pap smear indicated due to H/O TAH/BSO Labs with cardiologist and PCP No lab work today Will try Vit E capsules twice weekly.  Will sent rx to compounding pharmacy. return annually or prn

## 2016-02-05 ENCOUNTER — Encounter: Payer: Self-pay | Admitting: Gastroenterology

## 2016-07-15 ENCOUNTER — Other Ambulatory Visit: Payer: Self-pay | Admitting: Cardiology

## 2016-07-16 NOTE — Telephone Encounter (Signed)
Rx(s) sent to pharmacy electronically.  

## 2016-09-11 ENCOUNTER — Other Ambulatory Visit: Payer: Self-pay | Admitting: Obstetrics & Gynecology

## 2016-09-11 DIAGNOSIS — Z1231 Encounter for screening mammogram for malignant neoplasm of breast: Secondary | ICD-10-CM

## 2016-11-04 ENCOUNTER — Ambulatory Visit
Admission: RE | Admit: 2016-11-04 | Discharge: 2016-11-04 | Disposition: A | Discharge status: Home / Self Care | Payer: BLUE CROSS/BLUE SHIELD | Source: Ambulatory Visit | Attending: Obstetrics & Gynecology | Admitting: Obstetrics & Gynecology

## 2016-11-04 DIAGNOSIS — Z1231 Encounter for screening mammogram for malignant neoplasm of breast: Secondary | ICD-10-CM

## 2016-11-06 ENCOUNTER — Ambulatory Visit: Payer: BLUE CROSS/BLUE SHIELD | Admitting: Cardiology

## 2016-11-19 ENCOUNTER — Other Ambulatory Visit: Payer: Self-pay | Admitting: Cardiology

## 2017-01-18 ENCOUNTER — Other Ambulatory Visit: Payer: Self-pay | Admitting: Cardiology

## 2017-02-04 DIAGNOSIS — Z85828 Personal history of other malignant neoplasm of skin: Secondary | ICD-10-CM | POA: Diagnosis not present

## 2017-02-04 DIAGNOSIS — L821 Other seborrheic keratosis: Secondary | ICD-10-CM | POA: Diagnosis not present

## 2017-02-04 DIAGNOSIS — D1801 Hemangioma of skin and subcutaneous tissue: Secondary | ICD-10-CM | POA: Diagnosis not present

## 2017-02-04 DIAGNOSIS — L814 Other melanin hyperpigmentation: Secondary | ICD-10-CM | POA: Diagnosis not present

## 2017-02-04 NOTE — Progress Notes (Signed)
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  Since I last saw him he has done well.  The patient denies any new symptoms such as chest discomfort, neck or arm discomfort. There has been no new shortness of breath, PND or orthopnea. There have been no reported palpitations, presyncope or syncope.  She is not walking like she used to.  She does have some leg tiredness and wonders if this could be the Lipitor.  She previously did not tolerate Pravachol.   Her children are now 30 and 22 and they keep her busy.    Allergies  Allergen Reactions  . Hydromorphone Hcl Nausea And Vomiting  . Oxycodone-Acetaminophen Itching  . Dilaudid [Hydromorphone Hcl]     sensitivity    Current Outpatient Prescriptions  Medication Sig Dispense Refill  . aspirin 81 MG tablet Take 81 mg by mouth daily.    . cholecalciferol (VITAMIN D) 1000 UNITS tablet Take 1,000 Units by mouth 3 (three) times daily.     . Coenzyme Q10 (COQ10) 100 MG CAPS Take 100 mg by mouth daily.    . metoprolol succinate (TOPROL-XL) 25 MG 24 hr tablet take 1 tablet by mouth every morning 30 tablet 0  . Multiple Vitamin (MULTIVITAMIN) tablet Take 1 tablet by mouth daily. With calcium 200 mg and vitamin D 1000mg     . nitroGLYCERIN (NITROSTAT) 0.4 MG SL tablet Place 1 tablet (0.4 mg total) under the tongue every 5 (five) minutes as needed for chest pain. 25 tablet 2  . rosuvastatin (CRESTOR) 10 MG tablet Take 1 tablet (10 mg total) by mouth daily. 90 tablet 3   No current facility-administered medications for this visit.     Past Medical History:  Diagnosis Date  . BCC (basal cell carcinoma of skin)    right leg, back  . 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 Colon polyp   . Endometriosis 1989  . Hyperlipidemia   . Hypertension   . Myocardial infarction ([place])    2013  . Retinal vein occlusion    Right (July 2008)    Past Surgical History:  Procedure  Laterality Date  . APPENDECTOMY    . CESAREAN SECTION  2000, 2002   x2  . COLONOSCOPY    . LEFT HEART CATH N/A 08/25/2011   Procedure: LEFT HEART CATH;  Surgeon: Sherren Mocha, MD;  Location: [facility] CATH LAB;  Service: Cardiovascular;  Laterality: N/A;  . TOTAL ABDOMINAL HYSTERECTOMY W/ BILATERAL SALPINGOOPHORECTOMY  12/08    ROS:  As stated in the HPI and negative for all other systems.  PHYSICAL EXAM BP 106/78   Pulse (!) 52   Ht 5\' 8"  (1.727 m)   Wt 155 lb (70.3 kg)   LMP 05/10/2007   BMI 23.57 kg/m   GENERAL:  Well appearing NECK:  No jugular venous distention, waveform within normal limits, carotid upstroke brisk and symmetric, no bruits, no thyromegaly LUNGS:  Clear to auscultation bilaterally CHEST:  Unremarkable HEART:  PMI not displaced or sustained,S1 and S2 within normal limits, no S3, no S4, no clicks, no rubs, no murmurs ABD:  Flat, positive bowel sounds normal in frequency in pitch, no bruits, no rebound, no guarding, no midline pulsatile mass, no hepatomegaly, no splenomegaly EXT:  2 plus pulses throughout, no edema, no cyanosis no clubbing   EKG:  Sinus rhythm, rate 52 , axis within normal limits, intervals within normal limits, no acute  ST-T wave changes.  02/06/2017   Lab Results  Component Value Date   CHOL 148 10/29/2015   TRIG 125 10/29/2015   HDL 52 10/29/2015   LDLCALC 71 10/29/2015    ASSESSMENT AND PLAN;  SCAD (coronary artery disease) -  She's having no symptoms. We talked about getting back to her exercise regimen and otherwise she'll continue the meds as listed. No further cardiovascular testing is suggested.  Dyslipidemia -  She has some symptoms with the Lipitor potentially. We'll going to try Crestor.  She will take 1/2 of a 10 mg daily.   I will check labs in 8 weeks to include CMET, CBC and lipids.

## 2017-02-05 ENCOUNTER — Ambulatory Visit (INDEPENDENT_AMBULATORY_CARE_PROVIDER_SITE_OTHER): Payer: BLUE CROSS/BLUE SHIELD | Admitting: Cardiology

## 2017-02-05 ENCOUNTER — Encounter: Payer: Self-pay | Admitting: Cardiology

## 2017-02-05 ENCOUNTER — Other Ambulatory Visit: Payer: Self-pay | Admitting: Cardiology

## 2017-02-05 VITALS — BP 106/78 | HR 52 | Ht 68.0 in | Wt 155.0 lb

## 2017-02-05 DIAGNOSIS — I2542 Coronary artery dissection: Secondary | ICD-10-CM

## 2017-02-05 DIAGNOSIS — E785 Hyperlipidemia, unspecified: Secondary | ICD-10-CM

## 2017-02-05 DIAGNOSIS — Z79899 Other long term (current) drug therapy: Secondary | ICD-10-CM

## 2017-02-05 MED ORDER — ROSUVASTATIN CALCIUM 10 MG PO TABS: 10.0000 mg | ORAL_TABLET | Freq: Every day | ORAL | 3 refills | Status: DC

## 2017-02-05 NOTE — Patient Instructions (Addendum)
Medication Instructions:  STOP- Atorvastatin START- Crestor 10 mg daily  If you need a refill on your cardiac medications before your next appointment, please call your pharmacy.  Labwork: Fasting Lipids, CBC, CMP in 8 weeks HERE IN OUR OFFICE AT [facility]  Testing/Procedures: None Ordered  Follow-Up: Your physician wants you to follow-up in: 1 Year You should receive a reminder letter in the mail two months in advance. If you do not receive a letter, please call our office [phone].     Thank you for choosing [facility] at [facility]!!

## 2017-02-06 ENCOUNTER — Encounter: Payer: Self-pay | Admitting: Cardiology

## 2017-02-16 ENCOUNTER — Other Ambulatory Visit: Payer: Self-pay | Admitting: Cardiology

## 2017-03-12 ENCOUNTER — Encounter: Payer: Self-pay | Admitting: Obstetrics & Gynecology

## 2017-03-12 ENCOUNTER — Ambulatory Visit (INDEPENDENT_AMBULATORY_CARE_PROVIDER_SITE_OTHER): Payer: BLUE CROSS/BLUE SHIELD | Admitting: Obstetrics & Gynecology

## 2017-03-12 ENCOUNTER — Telehealth: Payer: Self-pay | Admitting: Obstetrics & Gynecology

## 2017-03-12 VITALS — BP 102/64 | HR 64 | Resp 12 | Wt 157.0 lb

## 2017-03-12 DIAGNOSIS — R2233 Localized swelling, mass and lump, upper limb, bilateral: Secondary | ICD-10-CM

## 2017-03-12 NOTE — Telephone Encounter (Signed)
Patient has a lump under both armpits. She states this has been going on for 3 weeks.

## 2017-03-12 NOTE — Progress Notes (Signed)
Scheduled patient while in office for bilateral diagnostic mammogram [facility] on 03[facility]18/2017 at 1:30 pm. Patient is agreeable to date and time. Placed in mammogram hold.

## 2017-03-12 NOTE — Progress Notes (Signed)
GYNECOLOGY  VISIT  CC:   Axillary lumps  HPI: 56 y.o. [id number] Married Caucasian female here for complaint of bilateral axillary masses/lumps which she states may just be fatty tissue.  However, they are more obvious and she does not like the appearance.  Would like to make sure this is nothing more.  Denies pain, nipple discharge, masses.  Last MMG 11/04/16.  She did a 3D MMG and this was normal.    GYNECOLOGIC HISTORY: Patient's last menstrual period was 05/10/2007. Contraception: hysterectomy Menopausal hormone therapy: none  Patient Active Problem List   Diagnosis Date Noted  . 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 Retinal vein occlusion   . CAD (coronary artery disease)   . Ventricular ectopy 08/23/2011  . Non-STEMI (non-ST elevated myocardial infarction) ([place]) 08/23/2011  . Dyslipidemia 08/23/2011  . RECTAL BLEEDING 07/05/2008  . ENDOMETRIOSIS 07/05/2008  . COLONIC POLYPS, HX OF 06/29/2008    Past Medical History:  Diagnosis Date  . BCC (basal cell carcinoma of skin)    right leg, back  . 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 Colon polyp   . Endometriosis 1989  . Hyperlipidemia   . Hypertension   . Myocardial infarction ([place])    2013  . Retinal vein occlusion    Right (July 2008)    Past Surgical History:  Procedure Laterality Date  . APPENDECTOMY    . CESAREAN SECTION  2000, 2002   x2  . COLONOSCOPY    . LEFT HEART CATH N/A 08/25/2011   Procedure: LEFT HEART CATH;  Surgeon: Sherren Mocha, MD;  Location: [facility] CATH LAB;  Service: Cardiovascular;  Laterality: N/A;  . TOTAL ABDOMINAL HYSTERECTOMY W/ BILATERAL SALPINGOOPHORECTOMY  12/08    MEDS:   Current Outpatient Prescriptions on File Prior to Visit  Medication Sig Dispense Refill  . aspirin 81 MG tablet Take 81 mg by mouth daily.    . cholecalciferol (VITAMIN D) 1000 UNITS tablet Take 1,000 Units by mouth 3 (three) times daily.     . Coenzyme Q10 (COQ10) 100 MG CAPS  Take 100 mg by mouth daily.    . metoprolol succinate (TOPROL-XL) 25 MG 24 hr tablet take 1 tablet by mouth every morning 30 tablet 11  . Multiple Vitamin (MULTIVITAMIN) tablet Take 1 tablet by mouth daily. With calcium 200 mg and vitamin D 1000mg     . nitroGLYCERIN (NITROSTAT) 0.4 MG SL tablet Place 1 tablet (0.4 mg total) under the tongue every 5 (five) minutes as needed for chest pain. 25 tablet 2  . rosuvastatin (CRESTOR) 10 MG tablet Take 1 tablet (10 mg total) by mouth daily. 90 tablet 3   No current facility-administered medications on file prior to visit.     ALLERGIES: Hydromorphone hcl; Oxycodone-acetaminophen; and Dilaudid [hydromorphone hcl]  Family History  Problem Relation Age of Onset  . Hyperlipidemia Father 66  . Colon polyps Father   . Hyperlipidemia Mother 43  . Hypertension Mother 81  . Heart disease Maternal Grandmother 38       Multiple MIs  . Multiple births Unknown        Mat. G-aunt, quadruplets  . Cancer - Lung Unknown        grandfather  . Diabetes Maternal Aunt   . Stomach cancer Maternal Grandfather   . Colon polyps Paternal Grandfather   . Uterine cancer Paternal Grandmother   . Colon cancer Neg Hx   . Breast cancer Neg Hx  SH:  Married, non smoker  Review of Systems  All other systems reviewed and are negative.   PHYSICAL EXAMINATION:   Vitals:   03/12/17 1604  BP: 102/64  Pulse: 64  Resp: 12   LMP 05/10/2007     Physical Exam  Constitutional: She appears well-developed and well-nourished.  Neck: Normal range of motion. Neck supple. No thyromegaly present.  Cardiovascular: Normal rate and regular rhythm.   Respiratory: Effort normal and breath sounds normal. Right breast exhibits no inverted nipple, no mass, no nipple discharge, no skin change and no tenderness. Left breast exhibits no inverted nipple, no mass, no nipple discharge, no skin change and no tenderness. Breasts are symmetrical.    Lymphadenopathy:    She has no  cervical adenopathy.    She has no axillary adenopathy.   Assessment: Bilateral axillary masses, possible lipomas  Plan: As pt would like to know what to do for treatment, feel should be sure if lipomas present or not.  If not, will refer to plastics for excision.      +8j

## 2017-03-12 NOTE — Telephone Encounter (Signed)
Spoke with patient. Patient states that 3-4 weeks ago she noticed a lump under each arm. Lumps are centered in axilla. Tender to the touch. Denies any redness, swelling, warmth to the area, or signs of in grown hair. Advised will need to be seen for further evaluation. Patient is agreeable. Appointment scheduled for 03/12/2017 at 4 pm with Dr.Miller. Patient is agreeable to date and time.  Routing to provider for final review. Patient agreeable to disposition. Will close encounter.

## 2017-03-18 ENCOUNTER — Ambulatory Visit
Admission: RE | Admit: 2017-03-18 | Discharge: 2017-03-18 | Disposition: A | Discharge status: Home / Self Care | Payer: BLUE CROSS/BLUE SHIELD | Source: Ambulatory Visit | Attending: Obstetrics & Gynecology | Admitting: Obstetrics & Gynecology

## 2017-03-18 DIAGNOSIS — R2233 Localized swelling, mass and lump, upper limb, bilateral: Secondary | ICD-10-CM

## 2017-03-18 DIAGNOSIS — N6489 Other specified disorders of breast: Secondary | ICD-10-CM | POA: Diagnosis not present

## 2017-03-18 DIAGNOSIS — R922 Inconclusive mammogram: Secondary | ICD-10-CM | POA: Diagnosis not present

## 2017-04-27 ENCOUNTER — Telehealth: Payer: Self-pay | Admitting: Obstetrics & Gynecology

## 2017-04-27 DIAGNOSIS — R2233 Localized swelling, mass and lump, upper limb, bilateral: Secondary | ICD-10-CM

## 2017-04-27 NOTE — Telephone Encounter (Signed)
I'm not sure she needs a referral but ok to place it if needed.  I think she can just call directly for appt.  Ok to provide number.

## 2017-04-27 NOTE — Telephone Encounter (Signed)
Patient calling to check on the status of referral to surgeon to remove lipoma

## 2017-04-27 NOTE — Telephone Encounter (Signed)
Dr.Miller, patient desires referral to plastic surgeon at this time. Okay to refer to Dr.David Harlow Mares?  Notes recorded by Huey Romans, RN on 03/18/2017 at 6:34 PM EDT Call to patient. Advised of result/information from Dr Sabra Heck. Patient undecided if desires referral. Will consider and call back if wants this information. ------  Notes recorded by Megan Salon, MD on 03/18/2017 at 4:11 PM EDT Please let pt know her MMG showed just fatty tissue. This didn't look like a lipoma. If she wants to see a Psychiatric nurse for removal, I can make some suggestions. Thanks. Ok to remove from [facility] hold.

## 2017-04-27 NOTE — Telephone Encounter (Signed)
Spoke with patient. Advised referral has been placed to [facility]. Advised she may contact his office regarding scheduling at [phone]. Patient is agreeable and will call to schedule. Will close encounter.

## 2017-05-04 DIAGNOSIS — Q831 Accessory breast: Secondary | ICD-10-CM | POA: Diagnosis not present

## 2017-05-14 ENCOUNTER — Ambulatory Visit: Payer: BLUE CROSS/BLUE SHIELD | Admitting: Obstetrics & Gynecology

## 2017-05-14 ENCOUNTER — Other Ambulatory Visit: Payer: Self-pay

## 2017-05-14 ENCOUNTER — Encounter: Payer: Self-pay | Admitting: Obstetrics & Gynecology

## 2017-05-14 VITALS — BP 102/70 | HR 80 | Resp 16 | Ht 68.0 in | Wt 156.0 lb

## 2017-05-14 DIAGNOSIS — Z01419 Encounter for gynecological examination (general) (routine) without abnormal findings: Secondary | ICD-10-CM | POA: Diagnosis not present

## 2017-05-14 DIAGNOSIS — Z23 Encounter for immunization: Secondary | ICD-10-CM | POA: Diagnosis not present

## 2017-05-14 DIAGNOSIS — R1011 Right upper quadrant pain: Secondary | ICD-10-CM

## 2017-05-14 NOTE — Progress Notes (Signed)
Patient scheduled while in office for RUQ [place] for RUQ abdominal pain. Spoke with Clarise Cruz at [place], scheduled for 05/15/17 arriving at 9:30am for 9:45am appt. NPO after midnight tonight. [place] location. Patient verbalizes understanding and is agreeable.

## 2017-05-14 NOTE — Progress Notes (Signed)
56 y.o. [id number] MarriedCaucasianF here for annual exam.  Doing well.  Has bilateral fatty tissue/breast tissue.  Has ultrasound.  Did see plastic surgeon.  Considering removal.    Has been experiencing achy pains RUQ that is above and the right of her belly button.  Has been going on for months.  No bowel habit changes.  No nausea.  No fevers.  Patient's last menstrual period was 05/10/2007.          Sexually active: Yes.    The current method of family planning is status post hysterectomy.    Exercising: No.  The patient does not participate in regular exercise at present. Smoker:  no  Health Maintenance: Pap:  04/08/07 negative  History of abnormal Pap:  no MMG:  03/18/17 [place] right and left BIRADS2:benign. F/u 1 year. Colonoscopy:  01/30/16 polyps. F/u 3 years  BMD:   10/29/09 normal   TDaP:  05/10/07. Will get one today Pneumonia vaccine(s):  No Zostavax:  No Hep C testing: 2016 negative  Screening Labs: cardiologist.    reports that  has never smoked. she has never used smokeless tobacco. She reports that she does not drink alcohol or use drugs.  Past Medical History:  Diagnosis Date  . BCC (basal cell carcinoma of skin)    right leg, back  . 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 Colon polyp   . Endometriosis 1989  . Hyperlipidemia   . Hypertension   . Myocardial infarction ([place])    2013  . Retinal vein occlusion    Right (July 2008)    Past Surgical History:  Procedure Laterality Date  . APPENDECTOMY    . CESAREAN SECTION  2000, 2002   x2  . COLONOSCOPY    . LEFT HEART CATH N/A 08/25/2011   Procedure: LEFT HEART CATH;  Surgeon: Sherren Mocha, MD;  Location: [facility] CATH LAB;  Service: Cardiovascular;  Laterality: N/A;  . TOTAL ABDOMINAL HYSTERECTOMY W/ BILATERAL SALPINGOOPHORECTOMY  12/08    Current Outpatient Medications  Medication Sig Dispense Refill  . aspirin 81 MG tablet Take 81 mg by mouth daily.    . cholecalciferol  (VITAMIN D) 1000 UNITS tablet Take 1,000 Units by mouth 3 (three) times daily.     . Coenzyme Q10 (COQ10) 100 MG CAPS Take 100 mg by mouth daily.    . metoprolol succinate (TOPROL-XL) 25 MG 24 hr tablet take 1 tablet by mouth every morning 30 tablet 11  . Multiple Vitamin (MULTIVITAMIN) tablet Take 1 tablet by mouth daily. With calcium 200 mg and vitamin D 1000mg     . rosuvastatin (CRESTOR) 10 MG tablet Take 1 tablet (10 mg total) by mouth daily. 90 tablet 3  . nitroGLYCERIN (NITROSTAT) 0.4 MG SL tablet Place 1 tablet (0.4 mg total) under the tongue every 5 (five) minutes as needed for chest pain. (Patient not taking: Reported on 05/14/2017) 25 tablet 2   No current facility-administered medications for this visit.     Family History  Problem Relation Age of Onset  . Hyperlipidemia Father 46  . Colon polyps Father   . Hyperlipidemia Mother 61  . Hypertension Mother 73  . Heart disease Maternal Grandmother 30       Multiple MIs  . Multiple births Unknown        Mat. G-aunt, quadruplets  . Cancer - Lung Unknown        grandfather  . Diabetes Maternal Aunt   . Stomach  cancer Maternal Grandfather   . Colon polyps Paternal Grandfather   . Uterine cancer Paternal Grandmother   . Colon cancer Neg Hx   . Breast cancer Neg Hx     ROS:  Pertinent items are noted in HPI.  Otherwise, a comprehensive ROS was negative.  Exam:   BP 102/70 (BP Location: Right Arm, Patient Position: Sitting, Cuff Size: Normal)   Pulse 80   Resp 16   Ht 5\' 8"  (1.727 m)   Wt 156 lb (70.8 kg)   LMP 05/10/2007   BMI 23.72 kg/m      Height: 5\' 8"  (172.7 cm)  Ht Readings from Last 3 Encounters:  05/14/17 5\' 8"  (1.727 m)  02/05/17 5\' 8"  (1.727 m)  01/30/16 5\' 8"  (1.727 m)    General appearance: alert, cooperative and appears stated age Head: Normocephalic, without obvious abnormality, atraumatic Neck: no adenopathy, supple, symmetrical, trachea midline and thyroid normal to inspection and palpation Lungs:  clear to auscultation bilaterally Breasts: normal appearance, no masses or tenderness Heart: regular rate and rhythm Abdomen: soft, non-tender; bowel sounds normal; no masses,  no organomegaly Extremities: extremities normal, atraumatic, no cyanosis or edema Skin: Skin color, texture, turgor normal. No rashes or lesions Lymph nodes: Cervical, supraclavicular, and axillary nodes normal. No abnormal inguinal nodes palpated Neurologic: Grossly normal   Pelvic: External genitalia:  no lesions              Urethra:  normal appearing urethra with no masses, tenderness or lesions              Bartholins and Skenes: normal                 Vagina: normal appearing vagina with normal color and discharge, no lesions              Cervix: absent              Pap taken: No. Bimanual Exam:  Uterus:  uterus absent              Adnexa: normal adnexa and no mass, fullness, tenderness               Rectovaginal: Confirms               Anus:  normal sphincter tone, no lesions  Chaperone was present for exam.  A:  Well Woman with normal exam PMP, no HRT H/O retinal vein occlusion 7/08 H/O cardiac vessel dissection cuasing MI 3/13 followed yearly by Dr. Percival Spanish [facility] H/O TAH/BSO Grade D breast density Axillary fatty tissue vs breast tissue--has seen Dr. Harlow Mares.  Considering removal. RUQ pain  P:   Mammogram guidelines reviewed pap smear not indicated Tdap updated today Blood work UTD RUQ ultrasound will be scheduled for pt. return annually or prn

## 2017-05-15 ENCOUNTER — Other Ambulatory Visit: Payer: BLUE CROSS/BLUE SHIELD

## 2017-05-20 ENCOUNTER — Ambulatory Visit
Admission: RE | Admit: 2017-05-20 | Discharge: 2017-05-20 | Disposition: A | Discharge status: Home / Self Care | Payer: BLUE CROSS/BLUE SHIELD | Source: Ambulatory Visit | Attending: Obstetrics & Gynecology | Admitting: Obstetrics & Gynecology

## 2017-05-20 DIAGNOSIS — R1011 Right upper quadrant pain: Secondary | ICD-10-CM | POA: Diagnosis not present

## 2017-07-03 ENCOUNTER — Telehealth: Payer: Self-pay | Admitting: Cardiology

## 2017-07-03 NOTE — Telephone Encounter (Signed)
   Primary Cardiologist: Minus Breeding, MD  Chart reviewed as part of pre-operative protocol coverage. Patient was contacted 07/03/2017 in reference to pre-operative risk assessment for pending surgery as outlined below.  Kaylee Banks was last seen on 02/05/17 by Dr. Percival Spanish. She has history of NSTEMI due to SCAD in 2013, ventricular ectopy, HTN, HLD, retinal vein occlusion. SCAD was treated medically (on ASA). Since that day, Kaylee Banks has done very well. She walks regularly and does all her own housework. RCRI calculated at 0.9%. Able to perform 7.68 METS without any cardiac symptoms. She has no recent concerns. I did notice she is overdue for the planned labs that Dr. Percival Spanish ordered 01/2017 and the patient reports she is actually planning to arrange to come in next week to have those drawn. Based on ACC/AHA guidelines, Kaylee Banks would be at acceptable risk for the planned procedure without further cardiovascular testing.  Will obtain final input from Dr. Percival Spanish about antiplatelet therapy (pt is on aspirin). Specifically, the surgeon's office has requested to hold " ASA 2 weeks prior - "should we bridge with lovenox?"   I do not see specific indications to hold Lovenox but will forward to MD. 2 weeks also seems much longer than necessary given the length of antiplatelet activity after discontinuation. Dr. Percival Spanish, please route final thoughts to P CV DIV PREOP. Thank you.  Charlie Pitter, PA-C 07/03/2017, 3:42 PM

## 2017-07-03 NOTE — Telephone Encounter (Signed)
   [facility] Pre-operative Risk Assessment    Request for surgical clearance:  1. What type of surgery is being performed? Excision super mammary breast tissue   2. When is this surgery scheduled? TBS  3. What type of clearance is required (medical clearance vs. Pharmacy clearance to hold med vs. Both)? BOTH  4. Are there any medications that need to be held prior to surgery and how long? ASA 2 weeks prior - "should we bridge with lovenox?"   5. Practice name and name of physician performing surgery? Dr. Crissie Reese @ [facility])   6. What is your office phone and fax number? (p) [phone]  (f) [phone]   7. Anesthesia type (None, local, MAC, general) ? Not specified    Kaylee Banks 07/03/2017, 11:32 AM  _________________________________________________________________   (provider comments below)

## 2017-07-04 NOTE — Telephone Encounter (Signed)
She would be at higher risk for cardiovascular complications if she were to hold ASA.  I would suggest not holding the ASA and that the surgeon will need to discuss the risk of bleeding on ASA with the patient.

## 2017-07-06 NOTE — Telephone Encounter (Signed)
   Ok for surgery, however per Dr. Percival Spanish, DO NOT HOLD ASA, as she would be at a higher risk for cardiovascular complications. See his note below.   I will route clearance and medication recommendations to contact listed below, Dr. Crissie Reese, [phone].   Lyda Jester, PA-C 07/06/2017

## 2017-07-08 DIAGNOSIS — E785 Hyperlipidemia, unspecified: Secondary | ICD-10-CM | POA: Diagnosis not present

## 2017-07-08 DIAGNOSIS — Z79899 Other long term (current) drug therapy: Secondary | ICD-10-CM | POA: Diagnosis not present

## 2017-07-09 LAB — COMPREHENSIVE METABOLIC PANEL
ALT: 19 IU/L (ref 0–32)
AST: 21 IU/L (ref 0–40)
Albumin/Globulin Ratio: 2 (ref 1.2–2.2)
Albumin: 4.6 g/dL (ref 3.5–5.5)
Alkaline Phosphatase: 96 IU/L (ref 39–117)
BUN/Creatinine Ratio: 22 (ref 9–23)
BUN: 15 mg/dL (ref 6–24)
Bilirubin Total: 0.8 mg/dL (ref 0.0–1.2)
CO2: 22 mmol/L (ref 20–29)
Calcium: 9.7 mg/dL (ref 8.7–10.2)
Chloride: 104 mmol/L (ref 96–106)
Creatinine, Ser: 0.67 mg/dL (ref 0.57–1.00)
GFR calc Af Amer: 115 mL/min/{1.73_m2} (ref >59)
GFR calc non Af Amer: 100 mL/min/{1.73_m2} (ref >59)
Globulin, Total: 2.3 g/dL (ref 1.5–4.5)
Glucose: 82 mg/dL (ref 65–99)
Potassium: 4.5 mmol/L (ref 3.5–5.2)
Sodium: 145 mmol/L — ABNORMAL HIGH (ref 134–144)
Total Protein: 6.9 g/dL (ref 6.0–8.5)

## 2017-07-09 LAB — LIPID PANEL
Chol/HDL Ratio: 2.7 ratio (ref 0.0–4.4)
Cholesterol, Total: 146 mg/dL (ref 100–199)
HDL: 54 mg/dL (ref >39)
LDL Calculated: 64 mg/dL (ref 0–99)
Triglycerides: 141 mg/dL (ref 0–149)
VLDL Cholesterol Cal: 28 mg/dL (ref 5–40)

## 2017-07-09 LAB — CBC
Hematocrit: 42.2 % (ref 34.0–46.6)
Hemoglobin: 14.7 g/dL (ref 11.1–15.9)
MCH: 31.8 pg (ref 26.6–33.0)
MCHC: 34.8 g/dL (ref 31.5–35.7)
MCV: 91 fL (ref 79–97)
Platelets: 180 10*3/uL (ref 150–379)
RBC: 4.62 x10E6/uL (ref 3.77–5.28)
RDW: 12.5 % (ref 12.3–15.4)
WBC: 4.6 10*3/uL (ref 3.4–10.8)

## 2017-07-29 ENCOUNTER — Telehealth: Payer: Self-pay | Admitting: Cardiology

## 2017-07-29 NOTE — Telephone Encounter (Signed)
Pt calling in reference to clearance form that was sent from Dr Harlow Mares office concerning her getting off the aspirin before surgery. Pt stated it has been faxed 3 times with not reply from [facility] Please call pt.

## 2017-07-29 NOTE — Telephone Encounter (Signed)
Clearance addressed in 07/03/17 telephone note.  LM for patient with clearance info  Routed clearance note to Dr. Princess Perna message to patient in [place] w/info

## 2017-12-15 DIAGNOSIS — H33301 Unspecified retinal break, right eye: Secondary | ICD-10-CM | POA: Diagnosis not present

## 2017-12-16 DIAGNOSIS — H34831 Tributary (branch) retinal vein occlusion, right eye, with macular edema: Secondary | ICD-10-CM | POA: Diagnosis not present

## 2018-01-25 ENCOUNTER — Ambulatory Visit: Payer: BLUE CROSS/BLUE SHIELD | Admitting: Cardiology

## 2018-01-30 ENCOUNTER — Other Ambulatory Visit: Payer: Self-pay | Admitting: Cardiology

## 2018-01-31 NOTE — Progress Notes (Signed)
Cardiology Office Note   Date:  02/01/2018   ID:  Kaylee Banks, DOB 09-30-1960, MRN [id number]  PCP:  Shirline Frees, MD  Cardiologist:   Minus Breeding, MD   Chief Complaint  Patient presents with  . Palpitations      History of Present Illness: Kaylee Banks is a 57 y.o. female who presents for a follow up of SCAD.  Since I last saw her she is doing very well. The patient denies any new symptoms such as chest discomfort, neck or arm discomfort. There has been no new shortness of breath, PND or orthopnea. There have been no reported palpitations, presyncope or syncope.  She does housework but she is not exercising as much.  She did move her son in the college lifting and carrying recently and had no symptoms related to this.  She is had some increased "PVCs".  These happen more when she is at rest but she has not had any symptoms related to them.   Past Medical History:  Diagnosis Date  . BCC (basal cell carcinoma of skin)    right leg, back  . 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 Colon polyp   . Endometriosis 1989  . Hyperlipidemia   . Hypertension   . Myocardial infarction ([place])    2013  . Retinal vein occlusion    Right (July 2008)    Past Surgical History:  Procedure Laterality Date  . APPENDECTOMY    . CESAREAN SECTION  2000, 2002   x2  . COLONOSCOPY    . LEFT HEART CATH N/A 08/25/2011   Procedure: LEFT HEART CATH;  Surgeon: Sherren Mocha, MD;  Location: [facility] CATH LAB;  Service: Cardiovascular;  Laterality: N/A;  . TOTAL ABDOMINAL HYSTERECTOMY W/ BILATERAL SALPINGOOPHORECTOMY  12/08     Current Outpatient Medications  Medication Sig Dispense Refill  . aspirin 81 MG tablet Take 81 mg by mouth daily.    . cholecalciferol (VITAMIN D) 1000 UNITS tablet Take 1,000 Units by mouth 3 (three) times daily.     . Coenzyme Q10 (COQ10) 100 MG CAPS Take 100 mg by mouth daily.    . metoprolol succinate (TOPROL-XL) 25  MG 24 hr tablet take 1 tablet by mouth every morning 30 tablet 11  . Multiple Vitamin (MULTIVITAMIN) tablet Take 1 tablet by mouth daily. With calcium 200 mg and vitamin D 1000mg     . nitroGLYCERIN (NITROSTAT) 0.4 MG SL tablet Place 1 tablet (0.4 mg total) under the tongue every 5 (five) minutes as needed for chest pain. 25 tablet 2  . rosuvastatin (CRESTOR) 10 MG tablet Take 1 tablet (10 mg total) by mouth daily. 90 tablet 3   No current facility-administered medications for this visit.     Allergies:   Dilaudid [hydromorphone hcl]; Hydromorphone hcl; and Oxycodone-acetaminophen    ROS:  Please see the history of present illness.   Otherwise, review of systems are positive for none.   All other systems are reviewed and negative.    PHYSICAL EXAM: VS:  BP 118/80   Ht 5\' 8"  (1.727 m)   Wt 152 lb (68.9 kg)   LMP 05/10/2007   BMI 23.11 kg/m  , BMI Body mass index is 23.11 kg/m. GENERAL:  Well appearing NECK:  No jugular venous distention, waveform within normal limits, carotid upstroke brisk and symmetric, no bruits, no thyromegaly LUNGS:  Clear to auscultation bilaterally CHEST:  Unremarkable HEART:  PMI not displaced  or sustained,S1 and S2 within normal limits, no S3, no S4, no clicks, no rubs, no murmurs ABD:  Flat, positive bowel sounds normal in frequency in pitch, no bruits, no rebound, no guarding, no midline pulsatile mass, no hepatomegaly, no splenomegaly EXT:  2 plus pulses throughout, no edema, no cyanosis no clubbing    EKG:  EKG is ordered today. The ekg ordered today demonstrates   sinus rhythm, rate 55, axis within normal limits, intervals within normal limits, RSR prime V1 and V2, early transition lead V2.  No significant change from previous.   Recent Labs: 07/08/2017: ALT 19; BUN 15; Creatinine, Ser 0.67; Hemoglobin 14.7; Platelets 180; Potassium 4.5; Sodium 145    Lipid Panel    Component Value Date/Time   CHOL 146 07/08/2017 0958   TRIG 141 07/08/2017 0958     HDL 54 07/08/2017 0958   CHOLHDL 2.7 07/08/2017 0958   CHOLHDL 2.8 10/29/2015 0933   VLDL 25 10/29/2015 0933   LDLCALC 64 07/08/2017 0958      Wt Readings from Last 3 Encounters:  02/01/18 152 lb (68.9 kg)  05/14/17 156 lb (70.8 kg)  03/12/17 157 lb (71.2 kg)      Other studies Reviewed: Additional studies/ records that were reviewed today include: Labs. Review of the above records demonstrates:  Please see elsewhere in the note.     ASSESSMENT AND PLAN:  SCAD:  I will bring the patient back for a POET (Plain Old Exercise Test). This will allow me to screen for obstructive coronary disease, risk stratify and very importantly provide a prescription for exercise.  DYSLIPIDEMIA:    Her lipids were excellent. No change in therapy.   PVCs:  She will get an Alive Cor.    Current medicines are reviewed at length with the patient today.  The patient no concerns regarding medicines.  The following changes have been made:  no change  Labs/ tests ordered today include:   Orders Placed This Encounter  Procedures  . EXERCISE TOLERANCE TEST (ETT)  . EKG 12-Lead     Disposition:   FU with me in one year.     Signed, Minus Breeding, MD  02/01/2018 8:53 AM    [place]

## 2018-02-01 ENCOUNTER — Encounter: Payer: Self-pay | Admitting: Cardiology

## 2018-02-01 ENCOUNTER — Ambulatory Visit (INDEPENDENT_AMBULATORY_CARE_PROVIDER_SITE_OTHER): Payer: BLUE CROSS/BLUE SHIELD | Admitting: Cardiology

## 2018-02-01 VITALS — BP 118/80 | Ht 68.0 in | Wt 152.0 lb

## 2018-02-01 DIAGNOSIS — I2542 Coronary artery dissection: Secondary | ICD-10-CM | POA: Diagnosis not present

## 2018-02-01 DIAGNOSIS — R002 Palpitations: Secondary | ICD-10-CM | POA: Insufficient documentation

## 2018-02-01 DIAGNOSIS — I493 Ventricular premature depolarization: Secondary | ICD-10-CM | POA: Diagnosis not present

## 2018-02-01 NOTE — Patient Instructions (Signed)
Medication Instructions:  Continue current medications  If you need a refill on your cardiac medications before your next appointment, please call your pharmacy.  Labwork: None Ordered   Testing/Procedures: Your physician has requested that you have an exercise tolerance test. For further information please visit [email or web address]. Please also follow instruction sheet, as given.   Follow-Up: Your physician wants you to follow-up in: 1 Year. You should receive a reminder letter in the mail two months in advance. If you do not receive a letter, please call our office in [phone] to schedule your follow-up appointment.   ALIVECOR    Thank you for choosing [facility] at [facility]!!

## 2018-02-04 ENCOUNTER — Telehealth (HOSPITAL_COMMUNITY): Payer: Self-pay

## 2018-02-04 NOTE — Telephone Encounter (Signed)
Encounter complete. 

## 2018-02-05 ENCOUNTER — Ambulatory Visit: Payer: BLUE CROSS/BLUE SHIELD | Admitting: Cardiology

## 2018-02-09 ENCOUNTER — Ambulatory Visit (HOSPITAL_COMMUNITY)
Admission: RE | Admit: 2018-02-09 | Discharge: 2018-02-09 | Disposition: A | Discharge status: Home / Self Care | Payer: BLUE CROSS/BLUE SHIELD | Source: Ambulatory Visit | Attending: Cardiology | Admitting: Cardiology

## 2018-02-09 DIAGNOSIS — I2542 Coronary artery dissection: Secondary | ICD-10-CM

## 2018-02-09 LAB — EXERCISE TOLERANCE TEST
Estimated workload: 11.8 METS
Exercise duration (min): 10 min
Exercise duration (sec): 2 s
MPHR: 166 {beats}/min
Peak HR: 162 {beats}/min
Percent HR: 97 %
RPE: 17
Rest HR: 54 {beats}/min

## 2018-02-12 ENCOUNTER — Other Ambulatory Visit: Payer: Self-pay | Admitting: Obstetrics & Gynecology

## 2018-02-12 DIAGNOSIS — Z1231 Encounter for screening mammogram for malignant neoplasm of breast: Secondary | ICD-10-CM

## 2018-03-17 DIAGNOSIS — L820 Inflamed seborrheic keratosis: Secondary | ICD-10-CM | POA: Diagnosis not present

## 2018-03-17 DIAGNOSIS — L821 Other seborrheic keratosis: Secondary | ICD-10-CM | POA: Diagnosis not present

## 2018-03-17 DIAGNOSIS — Z85828 Personal history of other malignant neoplasm of skin: Secondary | ICD-10-CM | POA: Diagnosis not present

## 2018-03-17 DIAGNOSIS — L918 Other hypertrophic disorders of the skin: Secondary | ICD-10-CM | POA: Diagnosis not present

## 2018-03-23 ENCOUNTER — Ambulatory Visit: Payer: BLUE CROSS/BLUE SHIELD

## 2018-03-23 ENCOUNTER — Ambulatory Visit
Admission: RE | Admit: 2018-03-23 | Discharge: 2018-03-23 | Disposition: A | Discharge status: Home / Self Care | Payer: BLUE CROSS/BLUE SHIELD | Source: Ambulatory Visit | Attending: Obstetrics & Gynecology | Admitting: Obstetrics & Gynecology

## 2018-03-23 DIAGNOSIS — Z1231 Encounter for screening mammogram for malignant neoplasm of breast: Secondary | ICD-10-CM

## 2018-04-27 ENCOUNTER — Other Ambulatory Visit: Payer: Self-pay | Admitting: Cardiology

## 2018-07-23 ENCOUNTER — Ambulatory Visit: Payer: BLUE CROSS/BLUE SHIELD | Admitting: Obstetrics & Gynecology

## 2018-07-26 ENCOUNTER — Ambulatory Visit: Payer: BLUE CROSS/BLUE SHIELD | Admitting: Obstetrics & Gynecology

## 2018-07-26 ENCOUNTER — Other Ambulatory Visit: Payer: Self-pay

## 2018-07-26 ENCOUNTER — Encounter: Payer: Self-pay | Admitting: Obstetrics & Gynecology

## 2018-07-26 VITALS — BP 104/80 | HR 72 | Resp 16 | Ht 68.25 in | Wt 159.2 lb

## 2018-07-26 DIAGNOSIS — E2839 Other primary ovarian failure: Secondary | ICD-10-CM

## 2018-07-26 DIAGNOSIS — Z0000 Encounter for general adult medical examination without abnormal findings: Secondary | ICD-10-CM

## 2018-07-26 DIAGNOSIS — E034 Atrophy of thyroid (acquired): Secondary | ICD-10-CM | POA: Diagnosis not present

## 2018-07-26 DIAGNOSIS — Z01419 Encounter for gynecological examination (general) (routine) without abnormal findings: Secondary | ICD-10-CM | POA: Diagnosis not present

## 2018-07-26 DIAGNOSIS — E559 Vitamin D deficiency, unspecified: Secondary | ICD-10-CM | POA: Diagnosis not present

## 2018-07-26 NOTE — Progress Notes (Signed)
58 y.o. G55P2002 Married White or Caucasian female here for annual exam.  Did see Dr. Harlow Mares last year about axillary fat accumulation.  Dr. Percival Spanish would not allow her to stop her ASA.    Denies vaginal bleeding.    Patient's last menstrual period was 05/10/2007.          Sexually active: Yes.    The current method of family planning is status post hysterectomy.    Exercising: No.   Smoker:  no  Health Maintenance: Pap:  2008 Normal  History of abnormal Pap:  no MMG:  03/23/18 BIRADS1:neg  Colonoscopy:  01/30/16 polyps. F/u 3 years.  Aware this is due.   BMD:   10/29/09 Normal  TDaP:  2018 Pneumonia vaccine(s):  n/a Shingrix:   No Hep C testing: done  Screening Labs: Cardio   reports that she has never smoked. She has never used smokeless tobacco. She reports that she does not drink alcohol or use drugs.  Past Medical History:  Diagnosis Date  . BCC (basal cell carcinoma of skin)    right leg, back  . 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 Colon polyp   . Endometriosis 1989  . Hyperlipidemia   . Hypertension   . Myocardial infarction ([place])    2013  . Retinal vein occlusion    Right (July 2008)    Past Surgical History:  Procedure Laterality Date  . APPENDECTOMY    . CESAREAN SECTION  2000, 2002   x2  . COLONOSCOPY    . LEFT HEART CATH N/A 08/25/2011   Procedure: LEFT HEART CATH;  Surgeon: Sherren Mocha, MD;  Location: [facility] CATH LAB;  Service: Cardiovascular;  Laterality: N/A;  . TOTAL ABDOMINAL HYSTERECTOMY W/ BILATERAL SALPINGOOPHORECTOMY  12/08    Current Outpatient Medications  Medication Sig Dispense Refill  . aspirin 81 MG tablet Take 81 mg by mouth daily.    . cholecalciferol (VITAMIN D) 1000 UNITS tablet Take 1,000 Units by mouth 3 (three) times daily.     . Coenzyme Q10 (COQ10) 100 MG CAPS Take 100 mg by mouth daily.    . metoprolol succinate (TOPROL-XL) 25 MG 24 hr tablet TAKE 1 TABLET BY MOUTH EVERY MORNING 30 tablet  11  . Multiple Vitamin (MULTIVITAMIN) tablet Take 1 tablet by mouth daily. With calcium 200 mg and vitamin D 1000mg     . nitroGLYCERIN (NITROSTAT) 0.4 MG SL tablet Place 1 tablet (0.4 mg total) under the tongue every 5 (five) minutes as needed for chest pain. 25 tablet 2  . rosuvastatin (CRESTOR) 10 MG tablet TAKE 1 TABLET BY MOUTH ONCE DAILY 90 tablet 3   No current facility-administered medications for this visit.     Family History  Problem Relation Age of Onset  . Hyperlipidemia Father 42  . Colon polyps Father   . Hyperlipidemia Mother 58  . Hypertension Mother 31  . Heart disease Maternal Grandmother 2       Multiple MIs  . Multiple births Other        Mat. G-aunt, quadruplets  . Cancer - Lung Other        grandfather  . Diabetes Maternal Aunt   . Stomach cancer Maternal Grandfather   . Colon polyps Paternal Grandfather   . Uterine cancer Paternal Grandmother   . Colon cancer Neg Hx   . Breast cancer Neg Hx     Review of Systems  All other systems reviewed and are negative.  Exam:   BP 104/80 (BP Location: Right Arm, Patient Position: Sitting, Cuff Size: Normal)   Pulse 72   Resp 16   Ht 5' 8.25" (1.734 m)   Wt 159 lb 3.2 oz (72.2 kg)   LMP 05/10/2007   BMI 24.03 kg/m    Height: 5' 8.25" (173.4 cm)  Ht Readings from Last 3 Encounters:  07/26/18 5' 8.25" (1.734 m)  02/01/18 5\' 8"  (1.727 m)  05/14/17 5\' 8"  (1.727 m)    General appearance: alert, cooperative and appears stated age Head: Normocephalic, without obvious abnormality, atraumatic Neck: no adenopathy, supple, symmetrical, trachea midline and thyroid normal to inspection and palpation Lungs: clear to auscultation bilaterally Breasts: normal appearance, no masses or tenderness Heart: regular rate and rhythm Abdomen: soft, non-tender; bowel sounds normal; no masses,  no organomegaly Extremities: extremities normal, atraumatic, no cyanosis or edema Skin: Skin color, texture, turgor normal. No rashes  or lesions Lymph nodes: Cervical, supraclavicular, and axillary nodes normal. No abnormal inguinal nodes palpated Neurologic: Grossly normal   Pelvic: External genitalia:  no lesions              Urethra:  normal appearing urethra with no masses, tenderness or lesions              Bartholins and Skenes: normal                 Vagina: normal appearing vagina with normal color and discharge, no lesions              Cervix: absent              Pap taken: No. Bimanual Exam:  Uterus:  uterus absent              Adnexa: no mass, fullness, tenderness               Rectovaginal: Confirms               Anus:  normal sphincter tone, no lesions  Chaperone was present for exam.  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 Percival Spanish yearly) H/O [facility] of skin, sees derm yearly H/O TAH/BSO Axillary breast tissue, has seen plastic surgeon about this  P:   Mammogram guidelines reviewed.  Doing yearly 3D MMG.  May want to have ultrasound done of axilla if does not have tissue removed pap smear not indicated Shingrix vaccination discussed TSH and Vit D obtained today Colonoscopy due later this year.  She is aware.   Return annually or prn

## 2018-07-27 LAB — TSH: TSH: 2.15 u[IU]/mL (ref 0.450–4.500)

## 2018-07-27 LAB — VITAMIN D 25 HYDROXY (VIT D DEFICIENCY, FRACTURES): Vit D, 25-Hydroxy: 32.1 ng/mL (ref 30.0–100.0)

## 2018-08-09 ENCOUNTER — Ambulatory Visit
Admission: RE | Admit: 2018-08-09 | Discharge: 2018-08-09 | Disposition: A | Discharge status: Home / Self Care | Payer: BLUE CROSS/BLUE SHIELD | Source: Ambulatory Visit | Attending: Obstetrics & Gynecology | Admitting: Obstetrics & Gynecology

## 2018-08-09 DIAGNOSIS — E2839 Other primary ovarian failure: Secondary | ICD-10-CM

## 2018-08-09 DIAGNOSIS — Z780 Asymptomatic menopausal state: Secondary | ICD-10-CM | POA: Diagnosis not present

## 2018-08-09 DIAGNOSIS — M8588 Other specified disorders of bone density and structure, other site: Secondary | ICD-10-CM | POA: Diagnosis not present

## 2018-09-02 ENCOUNTER — Ambulatory Visit: Payer: BLUE CROSS/BLUE SHIELD | Admitting: Obstetrics & Gynecology

## 2018-12-07 ENCOUNTER — Other Ambulatory Visit: Payer: Self-pay | Admitting: Cardiology

## 2019-01-03 ENCOUNTER — Encounter: Payer: Self-pay | Admitting: Gastroenterology

## 2019-01-07 ENCOUNTER — Encounter: Payer: Self-pay | Admitting: Gastroenterology

## 2019-01-10 ENCOUNTER — Other Ambulatory Visit: Payer: Self-pay | Admitting: Obstetrics & Gynecology

## 2019-01-10 DIAGNOSIS — Z1231 Encounter for screening mammogram for malignant neoplasm of breast: Secondary | ICD-10-CM

## 2019-02-09 NOTE — Progress Notes (Signed)
Cardiology Office Note   Date:  02/11/2019   ID:  Kaylee Banks, DOB [birth year], MRN [id number]  PCP:  Shirline Frees, MD  Cardiologist:   Minus Breeding, MD   Chief Complaint  Patient presents with  . SCAD      History of Present Illness: Kaylee Banks is a 58 y.o. female who presents for a follow up of SCAD.  Last year she had a negative POET (Plain Old Exercise Treadmill).   Since I last saw her she is doing well.  She walks.  The patient denies any new symptoms such as chest discomfort, neck or arm discomfort. There has been no new shortness of breath, PND or orthopnea. There have been no reported palpitations, presyncope or syncope.   Of note l;ast year she had a POET (Plain Old Exercise Treadmill) and she worked for greater than 10 minutes and did exceptionally well with this.   Past Medical History:  Diagnosis Date  . BCC (basal cell carcinoma of skin)    right leg, back  . 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 Colon polyp   . Endometriosis 1989  . Hyperlipidemia   . Hypertension   . Myocardial infarction ([place])    2013  . Retinal vein occlusion    Right (July 2008)    Past Surgical History:  Procedure Laterality Date  . APPENDECTOMY    . CESAREAN SECTION  2000, 2002   x2  . COLONOSCOPY    . LEFT HEART CATH N/A 08/25/2011   Procedure: LEFT HEART CATH;  Surgeon: Sherren Mocha, MD;  Location: [facility] CATH LAB;  Service: Cardiovascular;  Laterality: N/A;  . TOTAL ABDOMINAL HYSTERECTOMY W/ BILATERAL SALPINGOOPHORECTOMY  12/08     Current Outpatient Medications  Medication Sig Dispense Refill  . aspirin 81 MG tablet Take 81 mg by mouth daily.    . cholecalciferol (VITAMIN D) 1000 UNITS tablet Take 1,000 Units by mouth 3 (three) times daily.     . Coenzyme Q10 (COQ10) 100 MG CAPS Take 100 mg by mouth daily.    . metoprolol succinate (TOPROL-XL) 25 MG 24 hr tablet TAKE 1 TABLET BY MOUTH EVERY MORNING 30 tablet 2  .  Multiple Vitamin (MULTIVITAMIN) tablet Take 1 tablet by mouth daily. With calcium 200 mg and vitamin D 1000mg     . nitroGLYCERIN (NITROSTAT) 0.4 MG SL tablet Place 1 tablet (0.4 mg total) under the tongue every 5 (five) minutes as needed for chest pain. 25 tablet PRN  . rosuvastatin (CRESTOR) 10 MG tablet TAKE 1 TABLET BY MOUTH ONCE DAILY 90 tablet 3   No current facility-administered medications for this visit.     Allergies:   Dilaudid [hydromorphone hcl], Hydromorphone hcl, and Oxycodone-acetaminophen    ROS:  Please see the history of present illness.   Otherwise, review of systems are positive for none.   All other systems are reviewed and negative.    PHYSICAL EXAM: VS:  BP 119/77   Pulse (!) 57   Ht 5\' 8"  (1.727 m)   Wt 163 lb (73.9 kg)   LMP 05/10/2007   SpO2 93%   BMI 24.78 kg/m  , BMI Body mass index is 24.78 kg/m. GENERAL:  Well appearing NECK:  No jugular venous distention, waveform within normal limits, carotid upstroke brisk and symmetric, no bruits, no thyromegaly LUNGS:  Clear to auscultation bilaterally CHEST:  Unremarkable HEART:  PMI not displaced or sustained,S1 and S2 within normal  limits, no S3, no S4, no clicks, no rubs, no murmurs ABD:  Flat, positive bowel sounds normal in frequency in pitch, no bruits, no rebound, no guarding, no midline pulsatile mass, no hepatomegaly, no splenomegaly EXT:  2 plus pulses throughout, no edema, no cyanosis no clubbing   EKG:  EKG is  ordered today. The ekg ordered today demonstrates   sinus rhythm, rate 57, axis within normal limits, intervals within normal limits, RSR prime V1 and V2, early transition lead V2.  No significant change from previous.   Recent Labs: 07/26/2018: TSH 2.150    Lipid Panel    Component Value Date/Time   CHOL 146 07/08/2017 0958   TRIG 141 07/08/2017 0958   HDL 54 07/08/2017 0958   CHOLHDL 2.7 07/08/2017 0958   CHOLHDL 2.8 10/29/2015 0933   VLDL 25 10/29/2015 0933   LDLCALC 64  07/08/2017 0958      Wt Readings from Last 3 Encounters:  02/11/19 163 lb (73.9 kg)  07/26/18 159 lb 3.2 oz (72.2 kg)  02/01/18 152 lb (68.9 kg)      Other studies Reviewed: Additional studies/ records that were reviewed today include: None. Review of the above records demonstrates:  Please see elsewhere in the note.     ASSESSMENT AND PLAN:  SCAD:     I will bring the patient back for a POET (Plain Old Exercise Test). This will allow me to screen for obstructive coronary disease, risk stratify and very importantly provide a prescription for exercise.  DYSLIPIDEMIA:    Check a lipid profile today.  Her lipids in the past have been excellent.  I will check liver enzymes.  PVCs:   She is not describing new palpitations.  No change in therapy.  She will get an Alive Cor.    Current medicines are reviewed at length with the patient today.  The patient no concerns regarding medicines.  The following changes have been made:  no change  Labs/ tests ordered today include:     Orders Placed This Encounter  Procedures  . CBC with Differential/Platelet  . Lipid panel  . Comprehensive metabolic panel  . EKG 12-Lead     Disposition:   FU with me in one  year.     Signed, Minus Breeding, MD  02/11/2019 8:44 AM    [facility]

## 2019-02-11 ENCOUNTER — Encounter: Payer: Self-pay | Admitting: Cardiology

## 2019-02-11 ENCOUNTER — Other Ambulatory Visit: Payer: Self-pay

## 2019-02-11 ENCOUNTER — Ambulatory Visit (INDEPENDENT_AMBULATORY_CARE_PROVIDER_SITE_OTHER): Payer: BC Managed Care – PPO | Admitting: Cardiology

## 2019-02-11 VITALS — BP 119/77 | HR 57 | Ht 68.0 in | Wt 163.0 lb

## 2019-02-11 DIAGNOSIS — I2542 Coronary artery dissection: Secondary | ICD-10-CM | POA: Diagnosis not present

## 2019-02-11 DIAGNOSIS — Z5181 Encounter for therapeutic drug level monitoring: Secondary | ICD-10-CM | POA: Diagnosis not present

## 2019-02-11 DIAGNOSIS — I493 Ventricular premature depolarization: Secondary | ICD-10-CM | POA: Diagnosis not present

## 2019-02-11 DIAGNOSIS — E785 Hyperlipidemia, unspecified: Secondary | ICD-10-CM | POA: Diagnosis not present

## 2019-02-11 MED ORDER — NITROGLYCERIN 0.4 MG SL SUBL: 0.4000 mg | SUBLINGUAL_TABLET | SUBLINGUAL | 99 refills | Status: DC | PRN

## 2019-02-11 NOTE — Patient Instructions (Signed)
Medication Instructions:  Your physician recommends that you continue on your current medications as directed. Please refer to the Current Medication list given to you today.  If you need a refill on your cardiac medications before your next appointment, please call your pharmacy.   Lab work: LP/CMET/CBC TODAY  If you have labs (blood work) drawn today and your tests are completely normal, you will receive your results only by: Marland Kitchen MyChart Message (if you have MyChart) OR . A paper copy in the mail If you have any lab test that is abnormal or we need to change your treatment, we will call you to review the results.  Testing/Procedures: NONE  Follow-Up: At [facility], you and your health needs are our priority.  As part of our continuing mission to provide you with exceptional heart care, we have created designated Provider Care Teams.  These Care Teams include your primary Cardiologist (physician) and Advanced Practice Providers (APPs -  Physician Assistants and Nurse Practitioners) who all work together to provide you with the care you need, when you need it. You will need a follow up appointment in 12 months.  Please call our office 2 months in advance to schedule this appointment.  You may see Minus Breeding, MD or one of the following Advanced Practice Providers on your designated Care Team:   Rosaria Ferries, PA-C . Jory Sims, DNP, ANP

## 2019-02-12 LAB — CBC WITH DIFFERENTIAL/PLATELET
Basophils Absolute: 0 10*3/uL (ref 0.0–0.2)
Basos: 1 %
EOS (ABSOLUTE): 0.2 10*3/uL (ref 0.0–0.4)
Eos: 4 %
Hematocrit: 44.8 % (ref 34.0–46.6)
Hemoglobin: 15 g/dL (ref 11.1–15.9)
Immature Grans (Abs): 0 10*3/uL (ref 0.0–0.1)
Immature Granulocytes: 0 %
Lymphocytes Absolute: 1 10*3/uL (ref 0.7–3.1)
Lymphs: 23 %
MCH: 30.7 pg (ref 26.6–33.0)
MCHC: 33.5 g/dL (ref 31.5–35.7)
MCV: 92 fL (ref 79–97)
Monocytes Absolute: 0.4 10*3/uL (ref 0.1–0.9)
Monocytes: 8 %
Neutrophils Absolute: 2.8 10*3/uL (ref 1.4–7.0)
Neutrophils: 64 %
Platelets: 173 10*3/uL (ref 150–450)
RBC: 4.88 x10E6/uL (ref 3.77–5.28)
RDW: 12.4 % (ref 11.7–15.4)
WBC: 4.3 10*3/uL (ref 3.4–10.8)

## 2019-02-12 LAB — COMPREHENSIVE METABOLIC PANEL
ALT: 23 IU/L (ref 0–32)
AST: 25 IU/L (ref 0–40)
Albumin/Globulin Ratio: 1.9 (ref 1.2–2.2)
Albumin: 4.5 g/dL (ref 3.8–4.9)
Alkaline Phosphatase: 85 IU/L (ref 39–117)
BUN/Creatinine Ratio: 28 — ABNORMAL HIGH (ref 9–23)
BUN: 19 mg/dL (ref 6–24)
Bilirubin Total: 0.8 mg/dL (ref 0.0–1.2)
CO2: 22 mmol/L (ref 20–29)
Calcium: 9.5 mg/dL (ref 8.7–10.2)
Chloride: 104 mmol/L (ref 96–106)
Creatinine, Ser: 0.67 mg/dL (ref 0.57–1.00)
GFR calc Af Amer: 114 mL/min/{1.73_m2} (ref >59)
GFR calc non Af Amer: 99 mL/min/{1.73_m2} (ref >59)
Globulin, Total: 2.4 g/dL (ref 1.5–4.5)
Glucose: 88 mg/dL (ref 65–99)
Potassium: 4.4 mmol/L (ref 3.5–5.2)
Sodium: 143 mmol/L (ref 134–144)
Total Protein: 6.9 g/dL (ref 6.0–8.5)

## 2019-02-12 LAB — LIPID PANEL
Chol/HDL Ratio: 2.9 ratio (ref 0.0–4.4)
Cholesterol, Total: 150 mg/dL (ref 100–199)
HDL: 51 mg/dL (ref >39)
LDL Chol Calc (NIH): 65 mg/dL (ref 0–99)
Triglycerides: 205 mg/dL — ABNORMAL HIGH (ref 0–149)
VLDL Cholesterol Cal: 34 mg/dL (ref 5–40)

## 2019-02-15 ENCOUNTER — Other Ambulatory Visit: Payer: Self-pay

## 2019-02-15 ENCOUNTER — Encounter: Payer: Self-pay | Admitting: Gastroenterology

## 2019-02-15 ENCOUNTER — Ambulatory Visit (AMBULATORY_SURGERY_CENTER): Payer: Self-pay | Admitting: *Deleted

## 2019-02-15 VITALS — Temp 96.9°F | Ht 68.0 in | Wt 165.0 lb

## 2019-02-15 DIAGNOSIS — Z86010 Personal history of colonic polyps: Secondary | ICD-10-CM

## 2019-02-15 MED ORDER — SUPREP BOWEL PREP KIT 17.5-3.13-1.6 GM/177ML PO SOLN: 1.0000 | Freq: Once | ORAL | 0 refills | Status: AC

## 2019-02-15 NOTE — Progress Notes (Signed)
No egg or soy allergy known to patient  No issues with past sedation with any surgeries  or procedures, no intubation problems  No diet pills per patient No home 02 use per patient  No blood thinners per patient  Pt denies issues with constipation  No A fib or A flutter  EMMI video sent to pt's e mail  Suprep $50 to pt in PV

## 2019-02-18 ENCOUNTER — Telehealth: Payer: Self-pay | Admitting: Gastroenterology

## 2019-02-21 NOTE — Telephone Encounter (Signed)
The pt BUN and CREAT are normal.  The pt advised she can proceed as scheduled for procedure

## 2019-02-25 ENCOUNTER — Telehealth: Payer: Self-pay | Admitting: Gastroenterology

## 2019-02-25 NOTE — Telephone Encounter (Signed)

## 2019-02-28 ENCOUNTER — Other Ambulatory Visit: Payer: Self-pay

## 2019-02-28 ENCOUNTER — Encounter: Payer: Self-pay | Admitting: Gastroenterology

## 2019-02-28 ENCOUNTER — Other Ambulatory Visit: Payer: Self-pay | Admitting: Gastroenterology

## 2019-02-28 ENCOUNTER — Ambulatory Visit (AMBULATORY_SURGERY_CENTER): Payer: BC Managed Care – PPO | Admitting: Gastroenterology

## 2019-02-28 VITALS — BP 115/72 | HR 62 | Temp 98.1°F | Resp 15 | Ht 68.0 in | Wt 165.0 lb

## 2019-02-28 DIAGNOSIS — Z1211 Encounter for screening for malignant neoplasm of colon: Secondary | ICD-10-CM | POA: Diagnosis not present

## 2019-02-28 DIAGNOSIS — D123 Benign neoplasm of transverse colon: Secondary | ICD-10-CM | POA: Diagnosis not present

## 2019-02-28 DIAGNOSIS — Z86010 Personal history of colonic polyps: Secondary | ICD-10-CM

## 2019-02-28 DIAGNOSIS — D120 Benign neoplasm of cecum: Secondary | ICD-10-CM

## 2019-02-28 MED ORDER — METOPROLOL SUCCINATE ER 25 MG PO TB24: 25.0000 mg | ORAL_TABLET | Freq: Every morning | ORAL | 2 refills | Status: DC

## 2019-02-28 MED ORDER — SODIUM CHLORIDE 0.9 % IV SOLN: 500.0000 mL | Freq: Once | INTRAVENOUS | Status: DC

## 2019-02-28 NOTE — Progress Notes (Signed)
Called to room to assist during endoscopic procedure.  Patient ID and intended procedure confirmed with present staff. Received instructions for my participation in the procedure from the performing physician.  

## 2019-02-28 NOTE — Progress Notes (Signed)
A and O x3. Report to RN. Tolerated MAC anesthesia well.

## 2019-02-28 NOTE — Op Note (Addendum)
[facility] Patient Name: Kaylee Banks Procedure Date: 02/28/2019 8:55 AM MRN: [id number] Endoscopist: Remo Lipps P. Havery [name] , MD Age: 58 Referring MD:  Date of Birth: 1960-07-27 Gender: Female Account #: [id number] Procedure:                Colonoscopy Indications:              Surveillance: Personal history of adenomatous /                            sessile serrated polyps on last colonoscopy 3 years                            ago Medicines:                Monitored Anesthesia Care Procedure:                Pre-Anesthesia Assessment:                           - Prior to the procedure, a History and Physical                            was performed, and patient medications and                            allergies were reviewed. The patient's tolerance of                            previous anesthesia was also reviewed. The risks                            and benefits of the procedure and the sedation                            options and risks were discussed with the patient.                            All questions were answered, and informed consent                            was obtained. Prior Anticoagulants: The patient has                            taken no previous anticoagulant or antiplatelet                            agents. ASA Grade Assessment: II - A patient with                            mild systemic disease. After reviewing the risks                            and benefits, the patient was deemed in  satisfactory condition to undergo the procedure.                           After obtaining informed consent, the colonoscope                            was passed under direct vision. Throughout the                            procedure, the patient's blood pressure, pulse, and                            oxygen saturations were monitored continuously. The                            Colonoscope was introduced through the anus  and                            advanced to the the cecum, identified by                            appendiceal orifice and ileocecal valve. The                            colonoscopy was performed without difficulty. The                            patient tolerated the procedure well. The quality                            of the bowel preparation was good. The ileocecal                            valve, appendiceal orifice, and rectum were                            photographed. Scope In: 9:06:34 AM Scope Out: 9:25:35 AM Scope Withdrawal Time: 0 hours 14 minutes 36 seconds  Total Procedure Duration: 0 hours 19 minutes 1 second  Findings:                 The perianal and digital rectal examinations were                            normal.                           A diminutive polyp was found in the cecum. The                            polyp was flat. The polyp was removed with a cold                            biopsy forceps, as it could not be grasped with the  snare. Resection and retrieval were complete.                           A 4 mm polyp was found in the cecum. The polyp was                            flat. The polyp was removed with a cold snare.                            Resection and retrieval were complete.                           A 4 mm polyp was found in the transverse colon. The                            polyp was flat. The polyp was removed with a cold                            snare. Resection and retrieval were complete.                           Anal papilla(e) were hypertrophied.                           The exam was otherwise without abnormality. Complications:            No immediate complications. Estimated blood loss:                            Minimal. Estimated Blood Loss:     Estimated blood loss was minimal. Impression:               - One diminutive polyp in the cecum, removed with a                            cold biopsy  forceps. Resected and retrieved.                           - One 4 mm polyp in the cecum, removed with a cold                            snare. Resected and retrieved.                           - One 4 mm polyp in the transverse colon, removed                            with a cold snare. Resected and retrieved.                           - Anal papilla(e) were hypertrophied.                           - The examination was otherwise normal.  Recommendation:           - Patient has a contact number available for                            emergencies. The signs and symptoms of potential                            delayed complications were discussed with the                            patient. Return to normal activities tomorrow.                            Written discharge instructions were provided to the                            patient.                           - Resume previous diet.                           - Continue present medications.                           - Await pathology results. Remo Lipps P. Havery [name], MD 02/28/2019 9:29:24 AM This report has been signed electronically.

## 2019-02-28 NOTE — Progress Notes (Signed)
Temperature taken by K.A., VS taken by C.W. 

## 2019-02-28 NOTE — Patient Instructions (Signed)
Please read handouts provided. Await pathology results. Continue present medications.      YOU HAD AN ENDOSCOPIC PROCEDURE TODAY AT THE [facility]:   Refer to the procedure report that was given to you for any specific questions about what was found during the examination.  If the procedure report does not answer your questions, please call your gastroenterologist to clarify.  If you requested that your care partner not be given the details of your procedure findings, then the procedure report has been included in a sealed envelope for you to review at your convenience later.  YOU SHOULD EXPECT: Some feelings of bloating in the abdomen. Passage of more gas than usual.  Walking can help get rid of the air that was put into your GI tract during the procedure and reduce the bloating. If you had a lower endoscopy (such as a colonoscopy or flexible sigmoidoscopy) you may notice spotting of blood in your stool or on the toilet paper. If you underwent a bowel prep for your procedure, you may not have a normal bowel movement for a few days.  Please Note:  You might notice some irritation and congestion in your nose or some drainage.  This is from the oxygen used during your procedure.  There is no need for concern and it should clear up in a day or so.  SYMPTOMS TO REPORT IMMEDIATELY:   Following lower endoscopy (colonoscopy or flexible sigmoidoscopy):  Excessive amounts of blood in the stool  Significant tenderness or worsening of abdominal pains  Swelling of the abdomen that is new, acute  Fever of 100F or higher    For urgent or emergent issues, a gastroenterologist can be reached at any hour by calling [phone].   DIET:  We do recommend a small meal at first, but then you may proceed to your regular diet.  Drink plenty of fluids but you should avoid alcoholic beverages for 24 hours.  ACTIVITY:  You should plan to take it easy for the rest of today and you should NOT  DRIVE or use heavy machinery until tomorrow (because of the sedation medicines used during the test).    FOLLOW UP: Our staff will call the number listed on your records 48-72 hours following your procedure to check on you and address any questions or concerns that you may have regarding the information given to you following your procedure. If we do not reach you, we will leave a message.  We will attempt to reach you two times.  During this call, we will ask if you have developed any symptoms of COVID 19. If you develop any symptoms (ie: fever, flu-like symptoms, shortness of breath, cough etc.) before then, please call [phone].  If you test positive for Covid 19 in the 2 weeks post procedure, please call and report this information to us.    If any biopsies were taken you will be contacted by phone or by letter within the next 1-3 weeks.  Please call us at [phone] if you have not heard about the biopsies in 3 weeks.    SIGNATURES/CONFIDENTIALITY: You and/or your care partner have signed paperwork which will be entered into your electronic medical record.  These signatures attest to the fact that that the information above on your After Visit Summary has been reviewed and is understood.  Full responsibility of the confidentiality of this discharge information lies with you and/or your care-partner. 

## 2019-02-28 NOTE — Progress Notes (Signed)
Pt's states no medical or surgical changes since previsit or office visit. 

## 2019-03-02 ENCOUNTER — Telehealth: Payer: Self-pay

## 2019-03-02 NOTE — Telephone Encounter (Signed)
  Follow up Call-  Call back number 02/28/2019  Post procedure Call Back phone  # [phone]  Permission to leave phone message Yes  Some recent data might be hidden     Patient questions:  Do you have a fever, pain , or abdominal swelling? No. Pain Score  0 *  Have you tolerated food without any problems? Yes.    Have you been able to return to your normal activities? Yes.    Do you have any questions about your discharge instructions: Diet   No. Medications  No. Follow up visit  No.  Do you have questions or concerns about your Care? No.  Actions: * If pain score is 4 or above: No action needed, pain <4.  1. Have you developed a fever since your procedure? no  2.   Have you had an respiratory symptoms (SOB or cough) since your procedure? no 3.   Have you tested positive for COVID 19 since your procedure no  4.   Have you had any family members/close contacts diagnosed with the COVID 19 since your procedure?  no   If yes to any of these questions please route to Joylene John, RN and Alphonsa Gin, Therapist, sports.

## 2019-03-02 NOTE — Telephone Encounter (Signed)
  Follow up Call-  Call back number 02/28/2019  Post procedure Call Back phone  # [phone]  Permission to leave phone message Yes  Some recent data might be hidden     Patient questions:  Do you have a fever, pain , or abdominal swelling? No. Pain Score  0 *  Have you tolerated food without any problems? Yes.    Have you been able to return to your normal activities? Yes.    Do you have any questions about your discharge instructions: Diet   No. Medications  No. Follow up visit  No.  Do you have questions or concerns about your Care? No.  Actions: * If pain score is 4 or above: No action needed, pain <4.  1. Have you developed a fever since your procedure? no  2.   Have you had an respiratory symptoms (SOB or cough) since your procedure? no 3.   Have you tested positive for COVID 19 since your procedure no  4.   Have you had any family members/close contacts diagnosed with the COVID 19 since your procedure? no   If yes to any of these questions please route to Joylene John, RN and Alphonsa Gin, Therapist, sports.

## 2019-03-25 ENCOUNTER — Other Ambulatory Visit: Payer: Self-pay

## 2019-03-25 ENCOUNTER — Ambulatory Visit
Admission: RE | Admit: 2019-03-25 | Discharge: 2019-03-25 | Disposition: A | Discharge status: Home / Self Care | Payer: BLUE CROSS/BLUE SHIELD | Source: Ambulatory Visit | Attending: Obstetrics & Gynecology | Admitting: Obstetrics & Gynecology

## 2019-03-25 DIAGNOSIS — Z1231 Encounter for screening mammogram for malignant neoplasm of breast: Secondary | ICD-10-CM

## 2019-04-12 ENCOUNTER — Other Ambulatory Visit: Payer: Self-pay

## 2019-04-12 MED ORDER — ROSUVASTATIN CALCIUM 10 MG PO TABS: 10.0000 mg | ORAL_TABLET | Freq: Every day | ORAL | 3 refills | Status: DC

## 2019-05-12 DIAGNOSIS — L821 Other seborrheic keratosis: Secondary | ICD-10-CM | POA: Diagnosis not present

## 2019-05-12 DIAGNOSIS — L918 Other hypertrophic disorders of the skin: Secondary | ICD-10-CM | POA: Diagnosis not present

## 2019-05-12 DIAGNOSIS — Z85828 Personal history of other malignant neoplasm of skin: Secondary | ICD-10-CM | POA: Diagnosis not present

## 2019-05-12 DIAGNOSIS — L570 Actinic keratosis: Secondary | ICD-10-CM | POA: Diagnosis not present

## 2019-05-12 DIAGNOSIS — L680 Hirsutism: Secondary | ICD-10-CM | POA: Diagnosis not present

## 2019-05-20 ENCOUNTER — Other Ambulatory Visit: Payer: Self-pay | Admitting: Cardiology

## 2019-10-21 ENCOUNTER — Other Ambulatory Visit: Payer: Self-pay

## 2019-10-21 NOTE — Progress Notes (Signed)
59 y.o. G1P2002 Married White or Caucasian female here for annual exam.  Doing well.  Has been vaccinated for Covid.  Denies vaginal bleeding.        Patient's last menstrual period was 05/10/2007.          Sexually active: Yes.    The current method of family planning is status post hysterectomy.    Exercising: Yes.    walking Smoker:  no  Health Maintenance: Pap:  2008 normal History of abnormal Pap:  no MMG:  03-28-2019 category b density birads 1:neg Colonoscopy:  02-28-2019.  Follow up 5 years.  Dr. Havery [name] BMD:   08-09-2018 f/u 19yrs TDaP:  2018 Pneumonia vaccine(s):  no Shingrix:   none Hep C testing: had done Screening Labs: Cardiologist   reports that she has never smoked. She has never used smokeless tobacco. She reports that she does not drink alcohol or use drugs.  Past Medical History:  Diagnosis Date  . Allergy    mild  . BCC (basal cell carcinoma of skin)    right leg, back  . 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 Colon polyp   . Endometriosis 1989  . Hyperlipidemia   . Hypertension   . Myocardial infarction ([place])    2013  . Osteopenia   . Retinal vein occlusion    Right (July 2008)    Past Surgical History:  Procedure Laterality Date  . APPENDECTOMY    . CESAREAN SECTION  2000, 2002   x2  . COLONOSCOPY    . LEFT HEART CATH N/A 08/25/2011   Procedure: LEFT HEART CATH;  Surgeon: Sherren Mocha, MD;  Location: [facility] CATH LAB;  Service: Cardiovascular;  Laterality: N/A;  . POLYPECTOMY    . TOTAL ABDOMINAL HYSTERECTOMY W/ BILATERAL SALPINGOOPHORECTOMY  12/08    Current Outpatient Medications  Medication Sig Dispense Refill  . aspirin 81 MG tablet Take 81 mg by mouth daily.    . cholecalciferol (VITAMIN D) 1000 UNITS tablet Take 1,000 Units by mouth 3 (three) times daily.     . Coenzyme Q10 (COQ10) 100 MG CAPS Take 100 mg by mouth daily.    . metoprolol succinate (TOPROL-XL) 25 MG 24 hr tablet TAKE 1 TABLET(25 MG) BY  MOUTH EVERY MORNING 30 tablet 8  . Multiple Vitamin (MULTIVITAMIN) tablet Take 1 tablet by mouth daily. With calcium 200 mg and vitamin D 1000mg     . nitroGLYCERIN (NITROSTAT) 0.4 MG SL tablet Place 1 tablet (0.4 mg total) under the tongue every 5 (five) minutes as needed for chest pain. 25 tablet PRN  . rosuvastatin (CRESTOR) 10 MG tablet Take 1 tablet (10 mg total) by mouth daily. 90 tablet 3   No current facility-administered medications for this visit.    Family History  Problem Relation Age of Onset  . Hyperlipidemia Father 26  . Colon polyps Father   . Hyperlipidemia Mother 18  . Hypertension Mother 73  . Heart disease Maternal Grandmother 53       Multiple MIs  . Multiple births Other        Mat. G-aunt, quadruplets  . Cancer - Lung Other        grandfather  . Diabetes Maternal Aunt   . Stomach cancer Maternal Grandfather   . Colon polyps Paternal Grandfather   . Uterine cancer Paternal Grandmother   . Colon cancer Neg Hx   . Breast cancer Neg Hx   . Esophageal cancer Neg Hx   .  Rectal cancer Neg Hx     Review of Systems  All other systems reviewed and are negative.   Exam:   BP 110/68 (BP Location: Right Arm, Patient Position: Sitting, Cuff Size: Normal)   Pulse 68   Temp 97.6 F (36.4 C) (Temporal)   Ht 5' 9.25" (1.759 m)   Wt 160 lb (72.6 kg)   LMP 05/10/2007   BMI 23.46 kg/m   Height: 5' 9.25" (175.9 cm)  General appearance: alert, cooperative and appears stated age Head: Normocephalic, without obvious abnormality, atraumatic Neck: no adenopathy, supple, symmetrical, trachea midline and thyroid normal to inspection and palpation Lungs: clear to auscultation bilaterally Breasts: normal appearance, no masses or tenderness Heart: regular rate and rhythm Abdomen: soft, non-tender; bowel sounds normal; no masses,  no organomegaly Extremities: extremities normal, atraumatic, no cyanosis or edema Skin: Skin color, texture, turgor normal. No rashes or  lesions Lymph nodes: Cervical, supraclavicular, and axillary nodes normal. No abnormal inguinal nodes palpated Neurologic: Grossly normal   Pelvic: External genitalia:  no lesions              Urethra:  normal appearing urethra with no masses, tenderness or lesions              Bartholins and Skenes: normal                 Vagina: normal appearing vagina with normal color and discharge, no lesions              Cervix: surgically absent              Pap taken: No. Bimanual Exam:  Uterus:  uterus absent              Adnexa: normal adnexa and no mass, fullness, tenderness               Rectovaginal: Confirms               Anus:  normal sphincter tone, no lesions  Chaperone, Terence Lux, CMA, was present for exam.  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 Percival Spanish yearly) H/o BCC of skin, sees derm yearly TAH/BSO Axillary breast tissue, saw plastic surgeon.  Considering surgical excision this year. Colon polyps  P:   Mammogram guidelines reviewed.  Doing yearly 3D MMG.   pap smear not indicated due to h/o TAH Shingrix vaccination discussed Lab work UTD Colonoscopy due again in 5 years Return annually or prn

## 2019-10-24 ENCOUNTER — Other Ambulatory Visit: Payer: Self-pay

## 2019-10-24 ENCOUNTER — Ambulatory Visit: Payer: BLUE CROSS/BLUE SHIELD | Admitting: Obstetrics & Gynecology

## 2019-10-24 ENCOUNTER — Encounter: Payer: Self-pay | Admitting: Obstetrics & Gynecology

## 2019-10-24 VITALS — BP 110/68 | HR 68 | Temp 97.6°F | Ht 69.25 in | Wt 160.0 lb

## 2019-10-24 DIAGNOSIS — Z01419 Encounter for gynecological examination (general) (routine) without abnormal findings: Secondary | ICD-10-CM | POA: Diagnosis not present

## 2019-12-09 ENCOUNTER — Ambulatory Visit: Payer: BLUE CROSS/BLUE SHIELD | Admitting: Obstetrics & Gynecology

## 2020-02-07 ENCOUNTER — Other Ambulatory Visit: Payer: Self-pay | Admitting: Cardiology

## 2020-02-08 ENCOUNTER — Other Ambulatory Visit: Payer: Self-pay | Admitting: Obstetrics & Gynecology

## 2020-02-08 DIAGNOSIS — Z0000 Encounter for general adult medical examination without abnormal findings: Secondary | ICD-10-CM

## 2020-02-13 DIAGNOSIS — Z7189 Other specified counseling: Secondary | ICD-10-CM | POA: Insufficient documentation

## 2020-02-13 NOTE — Progress Notes (Signed)
Cardiology Office Note   Date:  02/14/2020   ID:  Kaylee Banks, DOB August 01, 1960, MRN [id number]  PCP:  Shirline Frees, MD  Cardiologist:   Minus Breeding, MD   Chief Complaint  Patient presents with   Arm Tingling      History of Present Illness: Kaylee Banks is a 59 y.o. female who presents for a follow up of SCAD.   In 2019 she had a negative POET (Plain Old Exercise Treadmill).   Since I last saw her she has had about 3 episodes of her arms feeling heavy and numb randomly.  This happens when she is relaxed might just be sitting.  She might also have some dizzy episodes unrelated to this.  She is active doing household chores although she is not exercising routinely.  She cannot bring on any of the symptoms.  She had none of the symptoms that was her previous spontaneous coronary dissection.  She has had no chest pain, neck or arm discomfort.  The heaviness she feels in her arms is not associated with any arrhythmias.  She has had no visual voice or other motor disturbance.  She is otherwise felt well.  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   Colon polyp    Endometriosis 1989   Hyperlipidemia    Hypertension    Myocardial infarction [facility])    2013   Osteopenia    Retinal vein occlusion    Right (July 2008)    Past Surgical History:  Procedure Laterality Date   APPENDECTOMY     CESAREAN SECTION  2000, 2002   x2   COLONOSCOPY     LEFT HEART CATH N/A 08/25/2011   Procedure: LEFT HEART CATH;  Surgeon: Sherren Mocha, MD;  Location: [facility] CATH LAB;  Service: Cardiovascular;  Laterality: N/A;   POLYPECTOMY     TOTAL ABDOMINAL HYSTERECTOMY W/ BILATERAL SALPINGOOPHORECTOMY  12/08     Current Outpatient Medications  Medication Sig Dispense Refill   aspirin 81 MG tablet Take 81 mg by mouth daily.      cholecalciferol (VITAMIN D) 1000 UNITS tablet Take 1,000 Units by mouth 3 (three) times daily.      Coenzyme Q10 (COQ10) 100 MG CAPS Take 100 mg by mouth daily.     metoprolol succinate (TOPROL-XL) 25 MG 24 hr tablet TAKE 1 TABLET(25 MG) BY MOUTH EVERY MORNING 30 tablet 2   Multiple Vitamin (MULTIVITAMIN) tablet Take 1 tablet by mouth daily. With calcium 200 mg and vitamin D 1000mg      nitroGLYCERIN (NITROSTAT) 0.4 MG SL tablet Place 1 tablet (0.4 mg total) under the tongue every 5 (five) minutes as needed for chest pain. 25 tablet PRN   rosuvastatin (CRESTOR) 10 MG tablet Take 1 tablet (10 mg total) by mouth daily. 90 tablet 3   No current facility-administered medications for this visit.    Allergies:   Dilaudid [hydromorphone hcl], Hydromorphone hcl, and Oxycodone-acetaminophen    ROS:  Please see the history of present illness.   Otherwise, review of systems are positive for none.   All other systems are reviewed and negative.    PHYSICAL EXAM: VS:  BP 110/72    Pulse (!) 56    Ht 5' 9.25" (1.759 m)    Wt 158 lb 6.4  oz (71.8 kg)    LMP 05/10/2007    SpO2 98%    BMI 23.22 kg/m  , BMI Body mass index is 23.22 kg/m. GENERAL:  Well appearing NECK:  No jugular venous distention, waveform within normal limits, carotid upstroke brisk and symmetric, no bruits, no thyromegaly LUNGS:  Clear to auscultation bilaterally CHEST:  Unremarkable HEART:  PMI not displaced or sustained,S1 and S2 within normal limits, no S3, no S4, no clicks, no rubs, no murmurs ABD:  Flat, positive bowel sounds normal in frequency in pitch, no bruits, no rebound, no guarding, no midline pulsatile mass, no hepatomegaly, no splenomegaly EXT:  2 plus pulses throughout, no edema, no cyanosis no clubbing  EKG:  EKG is  ordered today. The ekg ordered today demonstrates   sinus rhythm, rate 56, axis within normal limits, intervals within normal limits, RSR prime V1 and V2, early transition lead V2.  No significant  change from previous.   Recent Labs: No results found for requested labs within last 8760 hours.    Lipid Panel    Component Value Date/Time   CHOL 150 02/11/2019 0859   TRIG 205 (H) 02/11/2019 0859   HDL 51 02/11/2019 0859   CHOLHDL 2.9 02/11/2019 0859   CHOLHDL 2.8 10/29/2015 0933   VLDL 25 10/29/2015 0933   LDLCALC 65 02/11/2019 0859      Wt Readings from Last 3 Encounters:  02/14/20 158 lb 6.4 oz (71.8 kg)  10/24/19 160 lb (72.6 kg)  02/28/19 165 lb (74.8 kg)      Other studies Reviewed: Additional studies/ records that were reviewed today include: None. Review of the above records demonstrates:  Please see elsewhere in the note.     ASSESSMENT AND PLAN:  SCAD:    The patient's had no new symptoms since a stress test in 2019.  She continues with risk reduction.  DYSLIPIDEMIA:    I will check a lipid profile and she will come back to have this drawn.  She was at [facility] most recently.   ARM TINGLING: I doubt that this is vascular.  At this point she has had a history of cervical disc disease diagnosed some years ago and I asked her to have follow-up of this but would be happy to reevaluate if this is not thought to be the cause of her events.  COVID EDUCATION: She has been vaccinated.  Current medicines are reviewed at length with the patient today.  The patient no concerns regarding medicines.  The following changes have been made:  no change  Labs/ tests ordered today include:     Orders Placed This Encounter  Procedures   Lipid panel   EKG 12-Lead     Disposition:   FU with me in one  year.     Signed, Minus Breeding, MD  02/14/2020 7:40 PM    [facility]

## 2020-02-14 ENCOUNTER — Ambulatory Visit (INDEPENDENT_AMBULATORY_CARE_PROVIDER_SITE_OTHER): Payer: BLUE CROSS/BLUE SHIELD | Admitting: Cardiology

## 2020-02-14 ENCOUNTER — Other Ambulatory Visit: Payer: Self-pay

## 2020-02-14 ENCOUNTER — Encounter: Payer: Self-pay | Admitting: Cardiology

## 2020-02-14 VITALS — BP 110/72 | HR 56 | Ht 69.25 in | Wt 158.4 lb

## 2020-02-14 DIAGNOSIS — I493 Ventricular premature depolarization: Secondary | ICD-10-CM

## 2020-02-14 DIAGNOSIS — I2542 Coronary artery dissection: Secondary | ICD-10-CM

## 2020-02-14 DIAGNOSIS — E785 Hyperlipidemia, unspecified: Secondary | ICD-10-CM | POA: Diagnosis not present

## 2020-02-14 DIAGNOSIS — Z7189 Other specified counseling: Secondary | ICD-10-CM | POA: Diagnosis not present

## 2020-02-14 NOTE — Patient Instructions (Signed)
Medication Instructions:  The current medical regimen is effective;  continue present plan and medications.  *If you need a refill on your cardiac medications before your next appointment, please call your pharmacy*   Lab Work: LIPID  If you have labs (blood work) drawn today and your tests are completely normal, you will receive your results only by: Marland Kitchen MyChart Message (if you have MyChart) OR . A paper copy in the mail If you have any lab test that is abnormal or we need to change your treatment, we will call you to review the results.   Follow-Up: At [facility], you and your health needs are our priority.  As part of our continuing mission to provide you with exceptional heart care, we have created designated Provider Care Teams.  These Care Teams include your primary Cardiologist (physician) and Advanced Practice Providers (APPs -  Physician Assistants and Nurse Practitioners) who all work together to provide you with the care you need, when you need it.  We recommend signing up for the patient portal called "MyChart".  Sign up information is provided on this After Visit Summary.  MyChart is used to connect with patients for Virtual Visits (Telemedicine).  Patients are able to view lab/test results, encounter notes, upcoming appointments, etc.  Non-urgent messages can be sent to your provider as well.   To learn more about what you can do with MyChart, go to [email or web address].    Your next appointment:   12 month(s)  The format for your next appointment:   In Person  Provider:   Minus Breeding, MD

## 2020-02-22 DIAGNOSIS — G959 Disease of spinal cord, unspecified: Secondary | ICD-10-CM | POA: Diagnosis not present

## 2020-03-07 ENCOUNTER — Other Ambulatory Visit (HOSPITAL_COMMUNITY): Payer: Self-pay | Admitting: Neurosurgery

## 2020-03-07 DIAGNOSIS — G959 Disease of spinal cord, unspecified: Secondary | ICD-10-CM

## 2020-03-12 ENCOUNTER — Other Ambulatory Visit: Payer: Self-pay | Admitting: Neurosurgery

## 2020-03-12 DIAGNOSIS — G959 Disease of spinal cord, unspecified: Secondary | ICD-10-CM

## 2020-03-19 ENCOUNTER — Other Ambulatory Visit: Payer: Self-pay | Admitting: Cardiology

## 2020-03-27 ENCOUNTER — Other Ambulatory Visit: Payer: Self-pay

## 2020-03-27 ENCOUNTER — Ambulatory Visit
Admission: RE | Admit: 2020-03-27 | Discharge: 2020-03-27 | Disposition: A | Discharge status: Home / Self Care | Payer: BLUE CROSS/BLUE SHIELD | Source: Ambulatory Visit | Attending: Obstetrics & Gynecology | Admitting: Obstetrics & Gynecology

## 2020-03-27 DIAGNOSIS — Z1231 Encounter for screening mammogram for malignant neoplasm of breast: Secondary | ICD-10-CM | POA: Diagnosis not present

## 2020-03-27 DIAGNOSIS — Z0000 Encounter for general adult medical examination without abnormal findings: Secondary | ICD-10-CM

## 2020-04-01 ENCOUNTER — Ambulatory Visit
Admission: RE | Admit: 2020-04-01 | Discharge: 2020-04-01 | Disposition: A | Discharge status: Home / Self Care | Payer: BLUE CROSS/BLUE SHIELD | Source: Ambulatory Visit | Attending: Neurosurgery | Admitting: Neurosurgery

## 2020-04-01 ENCOUNTER — Other Ambulatory Visit: Payer: Self-pay

## 2020-04-01 DIAGNOSIS — M4802 Spinal stenosis, cervical region: Secondary | ICD-10-CM | POA: Diagnosis not present

## 2020-04-01 DIAGNOSIS — G959 Disease of spinal cord, unspecified: Secondary | ICD-10-CM

## 2020-04-05 DIAGNOSIS — G959 Disease of spinal cord, unspecified: Secondary | ICD-10-CM | POA: Diagnosis not present

## 2020-05-05 ENCOUNTER — Other Ambulatory Visit: Payer: Self-pay | Admitting: Cardiology

## 2020-07-24 DIAGNOSIS — L814 Other melanin hyperpigmentation: Secondary | ICD-10-CM | POA: Diagnosis not present

## 2020-07-24 DIAGNOSIS — L821 Other seborrheic keratosis: Secondary | ICD-10-CM | POA: Diagnosis not present

## 2020-07-24 DIAGNOSIS — Z85828 Personal history of other malignant neoplasm of skin: Secondary | ICD-10-CM | POA: Diagnosis not present

## 2020-07-24 DIAGNOSIS — L820 Inflamed seborrheic keratosis: Secondary | ICD-10-CM | POA: Diagnosis not present

## 2020-07-24 DIAGNOSIS — L738 Other specified follicular disorders: Secondary | ICD-10-CM | POA: Diagnosis not present

## 2020-08-21 ENCOUNTER — Other Ambulatory Visit: Payer: Self-pay | Admitting: Cardiology

## 2020-09-19 IMAGING — MG DIGITAL SCREENING BILATERAL MAMMOGRAM WITH TOMO AND CAD
8 series · 8 of 24 positions shown · non-contrast
Comparison: Previous exam(s).

CLINICAL DATA: Screening.

EXAM:
DIGITAL SCREENING BILATERAL MAMMOGRAM WITH TOMO AND CAD

[L MLO synth-2D]
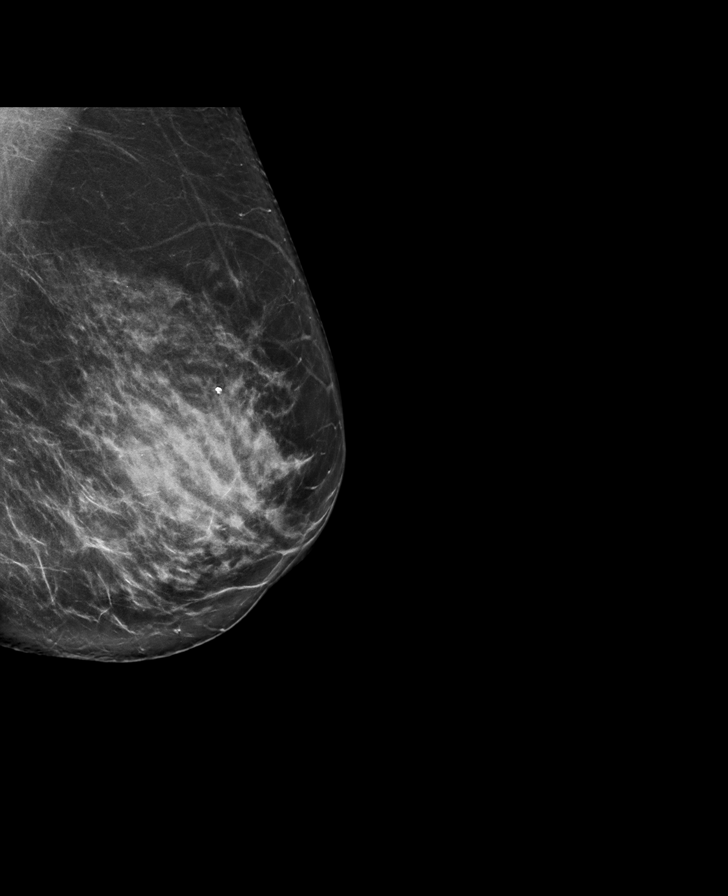

[R CC synth-2D]
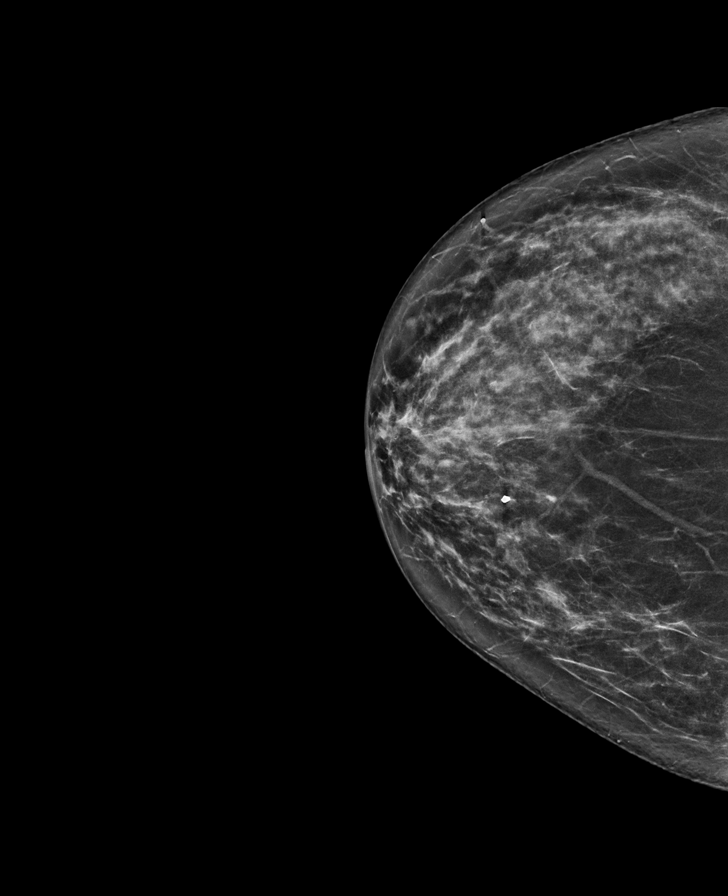

[L CC synth-2D]
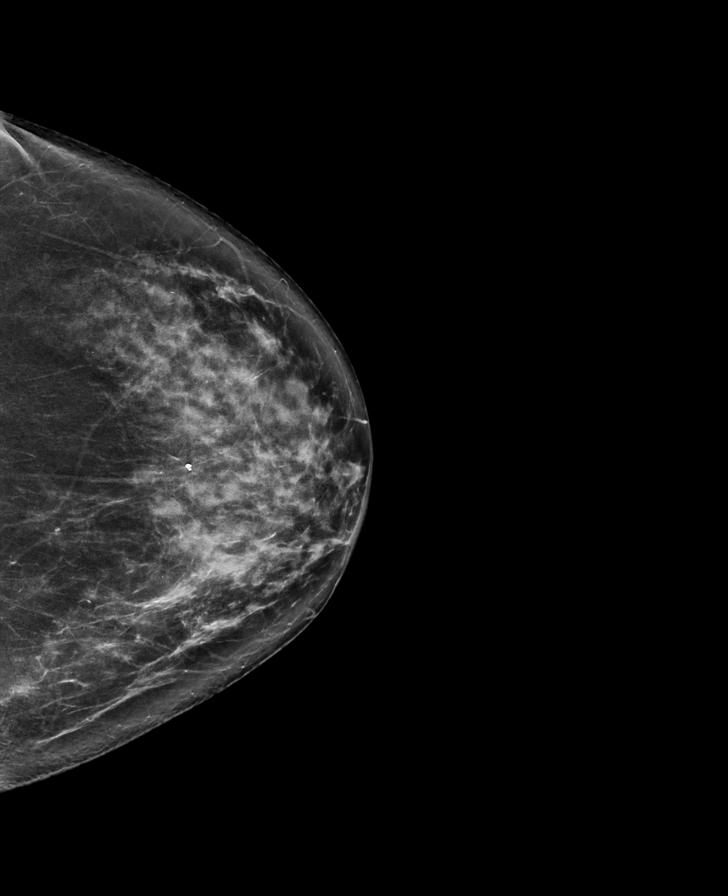

[R MLO synth-2D]
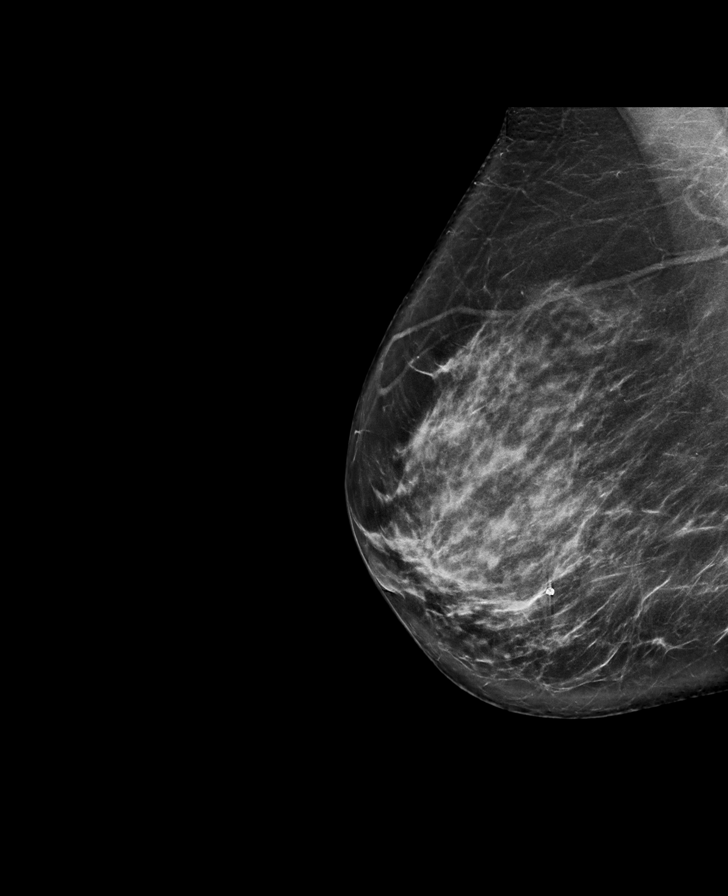

[R CC tomo · tomo slice 37/73.0]
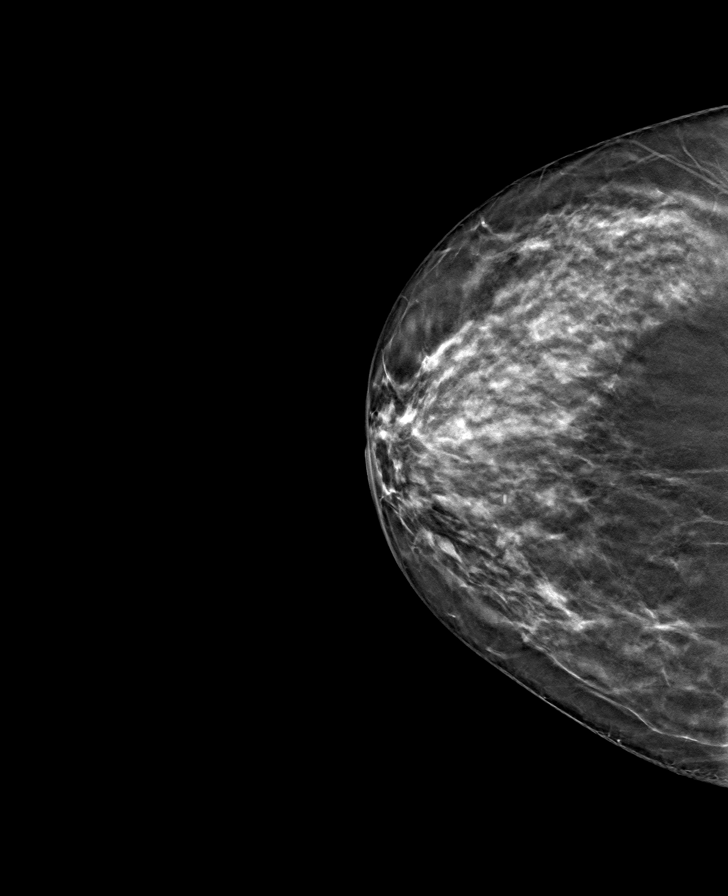

[R MLO tomo · tomo slice 42/83.0]
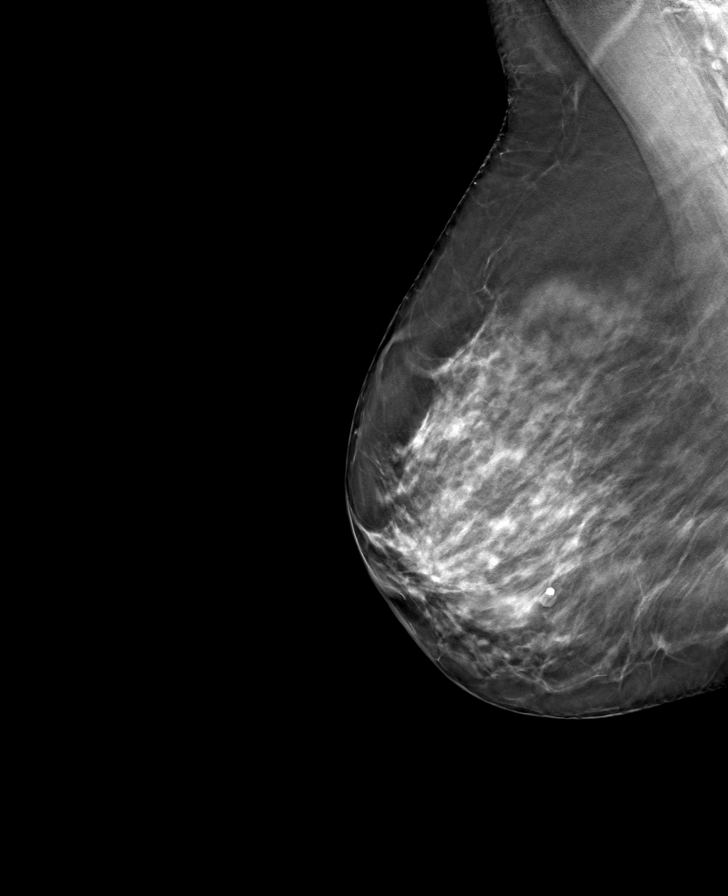

[L CC tomo · tomo slice 39/77.0]
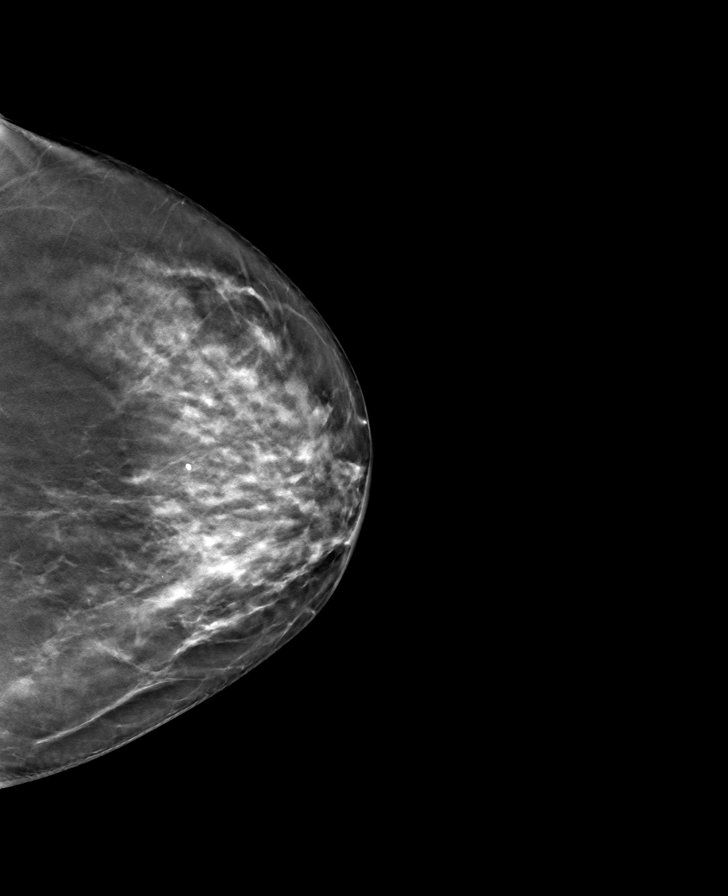

[L MLO tomo · tomo slice 42/83.0]
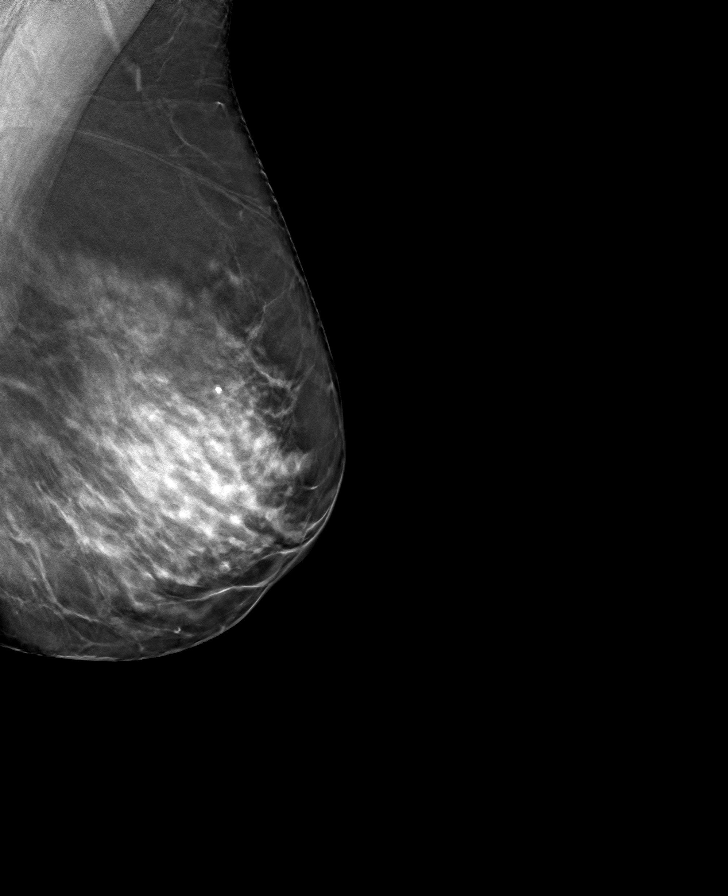

[8 of 24 positions shown; findings below may reference images not displayed]

ACR Breast Density Category d: The breast tissue is extremely dense,
which lowers the sensitivity of mammography.
FINDINGS: There are no findings suspicious for malignancy. Images were
processed with CAD.
IMPRESSION: No mammographic evidence of malignancy. A result letter of this
screening mammogram will be mailed directly to the patient.

RECOMMENDATION:
Screening mammogram in one year. (Code:RA-I-AVB)

BI-RADS CATEGORY  1: Negative.

## 2020-10-11 ENCOUNTER — Other Ambulatory Visit: Payer: Self-pay | Admitting: Obstetrics & Gynecology

## 2020-10-11 DIAGNOSIS — Z1231 Encounter for screening mammogram for malignant neoplasm of breast: Secondary | ICD-10-CM

## 2020-10-18 ENCOUNTER — Other Ambulatory Visit: Payer: Self-pay | Admitting: Cardiology

## 2020-10-26 DIAGNOSIS — H34831 Tributary (branch) retinal vein occlusion, right eye, with macular edema: Secondary | ICD-10-CM | POA: Diagnosis not present

## 2020-11-13 ENCOUNTER — Ambulatory Visit (HOSPITAL_BASED_OUTPATIENT_CLINIC_OR_DEPARTMENT_OTHER): Payer: BLUE CROSS/BLUE SHIELD | Admitting: Obstetrics & Gynecology

## 2020-11-15 ENCOUNTER — Ambulatory Visit (HOSPITAL_BASED_OUTPATIENT_CLINIC_OR_DEPARTMENT_OTHER): Payer: BLUE CROSS/BLUE SHIELD | Admitting: Obstetrics & Gynecology

## 2020-12-25 ENCOUNTER — Ambulatory Visit: Payer: BLUE CROSS/BLUE SHIELD | Admitting: Obstetrics & Gynecology

## 2021-01-28 ENCOUNTER — Encounter (HOSPITAL_BASED_OUTPATIENT_CLINIC_OR_DEPARTMENT_OTHER): Payer: Self-pay | Admitting: Obstetrics & Gynecology

## 2021-01-28 ENCOUNTER — Ambulatory Visit (INDEPENDENT_AMBULATORY_CARE_PROVIDER_SITE_OTHER): Payer: BC Managed Care – PPO | Admitting: Obstetrics & Gynecology

## 2021-01-28 ENCOUNTER — Other Ambulatory Visit: Payer: Self-pay

## 2021-01-28 VITALS — BP 123/76 | HR 59 | Ht 68.0 in | Wt 157.8 lb

## 2021-01-28 DIAGNOSIS — H348192 Central retinal vein occlusion, unspecified eye, stable: Secondary | ICD-10-CM | POA: Diagnosis not present

## 2021-01-28 DIAGNOSIS — Z780 Asymptomatic menopausal state: Secondary | ICD-10-CM | POA: Diagnosis not present

## 2021-01-28 DIAGNOSIS — Z90710 Acquired absence of both cervix and uterus: Secondary | ICD-10-CM

## 2021-01-28 DIAGNOSIS — I2542 Coronary artery dissection: Secondary | ICD-10-CM

## 2021-01-28 DIAGNOSIS — Z01419 Encounter for gynecological examination (general) (routine) without abnormal findings: Secondary | ICD-10-CM

## 2021-01-28 NOTE — Progress Notes (Signed)
60 y.o. G32P2002 Married White or Caucasian female here for annual exam.  Doing well.  Denies vaginal bleeding.    Daughter at [employer] and can graduate early.  Considering medical school.  Has some questions today.    Patient's last menstrual period was 05/10/2007.          Sexually active: Yes.    The current method of family planning is status post hysterectomy.    Exercising: does walk but reports not like she should Smoker:  no  Health Maintenance: Pap:  06/07/2007 History of abnormal Pap:  no MMG:  03/27/2020 Negative Colonoscopy:  02/28/2019, follow up 5 years BMD:   08/09/2018, normal TDaP:  2018 Shingrix:   discussed Hep C testing: done with Dr. Kenton Kingfisher Screening Labs: done 2020 and has lipid panel scheduled next week   reports that she has never smoked. She has never used smokeless tobacco. She reports that she does not drink alcohol and does not use drugs.  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   Colon polyp    Endometriosis 1989   Hyperlipidemia    Hypertension    Myocardial infarction [facility])    2013   Osteopenia    Retinal vein occlusion    Right (July 2008)    Past Surgical History:  Procedure Laterality Date   APPENDECTOMY     CESAREAN SECTION  2000, 2002   x2   COLONOSCOPY     LEFT HEART CATH N/A 08/25/2011   Procedure: LEFT HEART CATH;  Surgeon: Sherren Mocha, MD;  Location: [facility] CATH LAB;  Service: Cardiovascular;  Laterality: N/A;   POLYPECTOMY     TOTAL ABDOMINAL HYSTERECTOMY W/ BILATERAL SALPINGOOPHORECTOMY  12/08    Current Outpatient Medications  Medication Sig Dispense Refill   aspirin 81 MG tablet Take 81 mg by mouth daily.     Coenzyme Q10 (COQ10) 100 MG CAPS Take 100 mg by mouth daily.     metoprolol succinate (TOPROL-XL) 25 MG 24 hr tablet TAKE 1 TABLET(25 MG) BY MOUTH EVERY MORNING 90  tablet 1   Multiple Vitamin (MULTIVITAMIN) tablet Take 1 tablet by mouth daily. With calcium 200 mg and vitamin D '1000mg'$      nitroGLYCERIN (NITROSTAT) 0.4 MG SL tablet PLACE 1 ONE TABLET UNDER TONGUE AS NEEDED FOR CHEST PAIN EVERY 5 MINUTES 25 tablet PRN   rosuvastatin (CRESTOR) 10 MG tablet TAKE 1 TABLET(10 MG) BY MOUTH DAILY 90 tablet 3   cholecalciferol (VITAMIN D) 1000 UNITS tablet Take 1,000 Units by mouth 3 (three) times daily.  (Patient not taking: Reported on 01/28/2021)     No current facility-administered medications for this visit.    Family History  Problem Relation Age of Onset   Hyperlipidemia Father 35   Colon polyps Father    Hyperlipidemia Mother 41   Hypertension Mother 52   Heart disease Maternal Grandmother 57       Multiple MIs   Multiple births Other        Mat. G-aunt, quadruplets   Cancer - Lung Other        grandfather   Diabetes Maternal Aunt    Stomach cancer Maternal Grandfather    Colon polyps Paternal Grandfather    Uterine cancer Paternal Grandmother    Colon cancer Neg Hx  Breast cancer Neg Hx    Esophageal cancer Neg Hx    Rectal cancer Neg Hx     Review of Systems  All other systems reviewed and are negative.  Exam:   BP 123/76 (BP Location: Right Arm, Patient Position: Sitting, Cuff Size: Small)   Pulse (!) 59   Ht '5\' 8"'$  (1.727 m)   Wt 157 lb 12.8 oz (71.6 kg)   LMP 05/10/2007   BMI 23.99 kg/m   Height: '5\' 8"'$  (172.7 cm)  General appearance: alert, cooperative and appears stated age Head: Normocephalic, without obvious abnormality, atraumatic Neck: no adenopathy, supple, symmetrical, trachea midline and thyroid normal to inspection and palpation Lungs: clear to auscultation bilaterally Breasts: normal appearance, no masses or tenderness Heart: regular rate and rhythm Abdomen: soft, non-tender; bowel sounds normal; no masses,  no organomegaly Extremities: extremities normal, atraumatic, no cyanosis or edema Skin: Skin color,  texture, turgor normal. No rashes or lesions Lymph nodes: Cervical, supraclavicular, and axillary nodes normal. No abnormal inguinal nodes palpated Neurologic: Grossly normal   Pelvic: External genitalia:  no lesions              Urethra:  normal appearing urethra with no masses, tenderness or lesions              Bartholins and Skenes: normal                 Vagina: normal appearing vagina with normal color and no discharge, no lesions              Cervix: absent              Pap taken: No. Bimanual Exam:  Uterus:  uterus absent              Adnexa: no mass, fullness, tenderness               Rectovaginal: Confirms               Anus:  normal sphincter tone, no lesions  Chaperone, Octaviano Batty, CMA, was present for exam.  Assessment/Plan: 1. Well woman exam with routine gynecological exam - last pap smear 2008.  Not indicated due to hysterectomy - MMG 03/2020 - colonoscopy 2020 with follow up recommended in 5 years - BMD 08/2018, normal - vaccines reviewed/updated - lab work 2020 and has lipid panel with Dr. Percival Spanish planned  2. Retinal vein occlusion, unspecified laterality, unspecified retinal vein - 2008  3. Postmenopausal - no HRT  4. H/O: hysterectomy - with BSO due to endometriosis  5. Coronary artery dissection causing MI - followed by cardiology

## 2021-01-29 DIAGNOSIS — Z780 Asymptomatic menopausal state: Secondary | ICD-10-CM | POA: Insufficient documentation

## 2021-01-29 DIAGNOSIS — Z90710 Acquired absence of both cervix and uterus: Secondary | ICD-10-CM | POA: Insufficient documentation

## 2021-02-05 DIAGNOSIS — E785 Hyperlipidemia, unspecified: Secondary | ICD-10-CM | POA: Diagnosis not present

## 2021-02-05 LAB — LIPID PANEL
Chol/HDL Ratio: 3.1 ratio (ref 0.0–4.4)
Cholesterol, Total: 156 mg/dL (ref 100–199)
HDL: 51 mg/dL (ref >39)
LDL Chol Calc (NIH): 77 mg/dL (ref 0–99)
Triglycerides: 167 mg/dL — ABNORMAL HIGH (ref 0–149)
VLDL Cholesterol Cal: 28 mg/dL (ref 5–40)

## 2021-02-12 ENCOUNTER — Other Ambulatory Visit: Payer: Self-pay

## 2021-02-12 DIAGNOSIS — E785 Hyperlipidemia, unspecified: Secondary | ICD-10-CM

## 2021-02-19 NOTE — Progress Notes (Signed)
Cardiology Office Note   Date:  02/20/2021   ID:  Kaylee Banks, DOB August 16, 1960, MRN [id number]  PCP:  Shirline Frees, MD  Cardiologist:   Minus Breeding, MD   Chief Complaint  Patient presents with   SCAD     History of Present Illness: Kaylee Banks is a 60 y.o. female who presents for a follow up of SCAD.   In 2019 she had a negative POET (Plain Old Exercise Treadmill).   Since I last saw her she has done well.  The patient denies any new symptoms such as chest discomfort, neck or arm discomfort. There has been no new shortness of breath, PND or orthopnea. There have been no reported palpitations, presyncope or syncope.   She was not exercising routinely untly this year.  Now she and her husband are walking routinely.  There is some rowing machine occasionally.   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   Colon polyp    Endometriosis 1989   Hyperlipidemia    Hypertension    Myocardial infarction [facility])    2013   Osteopenia    Retinal vein occlusion    Right (July 2008)    Past Surgical History:  Procedure Laterality Date   APPENDECTOMY     CESAREAN SECTION  2000, 2002   x2   COLONOSCOPY     LEFT HEART CATH N/A 08/25/2011   Procedure: LEFT HEART CATH;  Surgeon: Sherren Mocha, MD;  Location: [facility] CATH LAB;  Service: Cardiovascular;  Laterality: N/A;   POLYPECTOMY     TOTAL ABDOMINAL HYSTERECTOMY W/ BILATERAL SALPINGOOPHORECTOMY  12/08     Current Outpatient Medications  Medication Sig Dispense Refill   aspirin 81 MG tablet Take 81 mg by mouth daily.     metoprolol succinate (TOPROL-XL) 25 MG 24 hr tablet TAKE 1 TABLET(25 MG) BY MOUTH EVERY MORNING 90 tablet 1   Multiple Vitamin (MULTIVITAMIN) tablet Take 1 tablet by mouth daily. With calcium 200 mg and vitamin D '1000mg'$      nitroGLYCERIN (NITROSTAT) 0.4  MG SL tablet PLACE 1 ONE TABLET UNDER TONGUE AS NEEDED FOR CHEST PAIN EVERY 5 MINUTES 25 tablet PRN   rosuvastatin (CRESTOR) 10 MG tablet TAKE 1 TABLET(10 MG) BY MOUTH DAILY 90 tablet 3   No current facility-administered medications for this visit.    Allergies:   Dilaudid [hydromorphone hcl], Hydromorphone hcl, and Oxycodone-acetaminophen    ROS:  Please see the history of present illness.   Otherwise, review of systems are positive for none.   All other systems are reviewed and negative.    PHYSICAL EXAM: VS:  BP 118/82   Pulse 60   Ht '5\' 8"'$  (1.727 m)   Wt 156 lb 6.4 oz (70.9 kg)   LMP 05/10/2007   SpO2 97%   BMI 23.78 kg/m  , BMI Body mass index is 23.78 kg/m. GENERAL:  Well appearing NECK:  No jugular venous distention, waveform within normal limits, carotid upstroke brisk and symmetric, no bruits, no thyromegaly LUNGS:  Clear to auscultation bilaterally CHEST:  Unremarkable HEART:  PMI not displaced or sustained,S1 and S2 within normal limits, no S3, no S4, no clicks, no rubs, no murmurs ABD:  Flat, positive bowel sounds normal in frequency in pitch, no bruits, no rebound, no guarding,  no midline pulsatile mass, no hepatomegaly, no splenomegaly EXT:  2 plus pulses throughout, no edema, no cyanosis no clubbing   EKG:  EKG is  ordered today. The ekg ordered today demonstrates   sinus rhythm, rate 60, axis within normal limits, intervals within normal limits, RSR prime V1 and V2, early transition lead V2.  No significant change from previous.   Recent Labs: No results found for requested labs within last 8760 hours.    Lipid Panel    Component Value Date/Time   CHOL 156 02/05/2021 0836   TRIG 167 (H) 02/05/2021 0836   HDL 51 02/05/2021 0836   CHOLHDL 3.1 02/05/2021 0836   CHOLHDL 2.8 10/29/2015 0933   VLDL 25 10/29/2015 0933   LDLCALC 77 02/05/2021 0836      Wt Readings from Last 3 Encounters:  02/20/21 156 lb 6.4 oz (70.9 kg)  01/28/21 157 lb 12.8 oz (71.6 kg)   02/14/20 158 lb 6.4 oz (71.8 kg)      Other studies Reviewed: Additional studies/ records that were reviewed today include: Labs. Review of the above records demonstrates:  Please see elsewhere in the note.     ASSESSMENT AND PLAN:  SCAD:   The patient has no new sypmtoms.  No further cardiovascular testing is indicated.  We will continue with aggressive risk reduction and meds as listed.  DYSLIPIDEMIA: She is very interested in fish oils and would like a prescription.  I will go ahead and order the Vascepa.  We talked about diet and exercise.  She is also having a little bit of ankle discomfort at times and might try herself off of Crestor for several weeks to see if she improves but would rechallenge herself.  She will let me know.    Current medicines are reviewed at length with the patient today.  The patient no concerns regarding medicines.  The following changes have been made:   As above.   Labs/ tests ordered today include:   None  No orders of the defined types were placed in this encounter.    Disposition:   FU with me in one year.   Signed, Minus Breeding, MD  02/20/2021 7:41 AM    [facility]

## 2021-02-20 ENCOUNTER — Other Ambulatory Visit: Payer: Self-pay

## 2021-02-20 ENCOUNTER — Encounter: Payer: Self-pay | Admitting: Cardiology

## 2021-02-20 ENCOUNTER — Ambulatory Visit (INDEPENDENT_AMBULATORY_CARE_PROVIDER_SITE_OTHER): Payer: BC Managed Care – PPO | Admitting: Cardiology

## 2021-02-20 VITALS — BP 118/82 | HR 60 | Ht 68.0 in | Wt 156.4 lb

## 2021-02-20 DIAGNOSIS — I2542 Coronary artery dissection: Secondary | ICD-10-CM

## 2021-02-20 DIAGNOSIS — E785 Hyperlipidemia, unspecified: Secondary | ICD-10-CM

## 2021-02-20 MED ORDER — ICOSAPENT ETHYL 1 G PO CAPS: 2.0000 g | ORAL_CAPSULE | Freq: Two times a day (BID) | ORAL | 1 refills | Status: DC

## 2021-02-20 NOTE — Patient Instructions (Signed)

## 2021-02-21 ENCOUNTER — Other Ambulatory Visit: Payer: Self-pay

## 2021-02-27 ENCOUNTER — Other Ambulatory Visit: Payer: Self-pay | Admitting: *Deleted

## 2021-02-27 DIAGNOSIS — E785 Hyperlipidemia, unspecified: Secondary | ICD-10-CM

## 2021-03-22 DIAGNOSIS — J029 Acute pharyngitis, unspecified: Secondary | ICD-10-CM | POA: Diagnosis not present

## 2021-03-22 DIAGNOSIS — R059 Cough, unspecified: Secondary | ICD-10-CM | POA: Diagnosis not present

## 2021-03-22 DIAGNOSIS — U071 COVID-19: Secondary | ICD-10-CM | POA: Diagnosis not present

## 2021-03-22 DIAGNOSIS — R509 Fever, unspecified: Secondary | ICD-10-CM | POA: Diagnosis not present

## 2021-03-28 ENCOUNTER — Ambulatory Visit: Payer: BLUE CROSS/BLUE SHIELD

## 2021-04-12 DIAGNOSIS — S92505A Nondisplaced unspecified fracture of left lesser toe(s), initial encounter for closed fracture: Secondary | ICD-10-CM | POA: Diagnosis not present

## 2021-04-19 ENCOUNTER — Ambulatory Visit
Admission: RE | Admit: 2021-04-19 | Discharge: 2021-04-19 | Disposition: A | Discharge status: Home / Self Care | Payer: BC Managed Care – PPO | Source: Ambulatory Visit | Attending: Obstetrics & Gynecology | Admitting: Obstetrics & Gynecology

## 2021-04-19 ENCOUNTER — Ambulatory Visit: Payer: BC Managed Care – PPO

## 2021-04-19 ENCOUNTER — Other Ambulatory Visit: Payer: Self-pay

## 2021-04-19 DIAGNOSIS — Z1231 Encounter for screening mammogram for malignant neoplasm of breast: Secondary | ICD-10-CM | POA: Diagnosis not present

## 2021-04-23 DIAGNOSIS — S92505D Nondisplaced unspecified fracture of left lesser toe(s), subsequent encounter for fracture with routine healing: Secondary | ICD-10-CM | POA: Diagnosis not present

## 2021-05-21 ENCOUNTER — Other Ambulatory Visit: Payer: Self-pay | Admitting: Cardiology

## 2021-09-06 DIAGNOSIS — L570 Actinic keratosis: Secondary | ICD-10-CM | POA: Diagnosis not present

## 2021-09-06 DIAGNOSIS — D485 Neoplasm of uncertain behavior of skin: Secondary | ICD-10-CM | POA: Diagnosis not present

## 2021-09-06 DIAGNOSIS — L918 Other hypertrophic disorders of the skin: Secondary | ICD-10-CM | POA: Diagnosis not present

## 2021-09-06 DIAGNOSIS — L720 Epidermal cyst: Secondary | ICD-10-CM | POA: Diagnosis not present

## 2021-09-06 DIAGNOSIS — L821 Other seborrheic keratosis: Secondary | ICD-10-CM | POA: Diagnosis not present

## 2021-09-06 DIAGNOSIS — Z85828 Personal history of other malignant neoplasm of skin: Secondary | ICD-10-CM | POA: Diagnosis not present

## 2021-09-11 ENCOUNTER — Encounter: Payer: Self-pay | Admitting: *Deleted

## 2021-11-01 DIAGNOSIS — H349 Unspecified retinal vascular occlusion: Secondary | ICD-10-CM | POA: Diagnosis not present

## 2021-11-01 DIAGNOSIS — H524 Presbyopia: Secondary | ICD-10-CM | POA: Diagnosis not present

## 2021-11-06 ENCOUNTER — Other Ambulatory Visit: Payer: Self-pay | Admitting: Obstetrics & Gynecology

## 2021-11-06 DIAGNOSIS — Z1231 Encounter for screening mammogram for malignant neoplasm of breast: Secondary | ICD-10-CM

## 2022-01-10 DIAGNOSIS — E785 Hyperlipidemia, unspecified: Secondary | ICD-10-CM | POA: Diagnosis not present

## 2022-01-11 LAB — LIPID PANEL
Chol/HDL Ratio: 3.4 ratio (ref 0.0–4.4)
Cholesterol, Total: 157 mg/dL (ref 100–199)
HDL: 46 mg/dL (ref >39)
LDL Chol Calc (NIH): 74 mg/dL (ref 0–99)
Triglycerides: 223 mg/dL — ABNORMAL HIGH (ref 0–149)
VLDL Cholesterol Cal: 37 mg/dL (ref 5–40)

## 2022-01-31 ENCOUNTER — Encounter (HOSPITAL_BASED_OUTPATIENT_CLINIC_OR_DEPARTMENT_OTHER): Payer: Self-pay | Admitting: Obstetrics & Gynecology

## 2022-01-31 ENCOUNTER — Ambulatory Visit (INDEPENDENT_AMBULATORY_CARE_PROVIDER_SITE_OTHER): Payer: BC Managed Care – PPO | Admitting: Obstetrics & Gynecology

## 2022-01-31 VITALS — BP 121/72 | HR 61 | Ht 68.0 in | Wt 166.2 lb

## 2022-01-31 DIAGNOSIS — I2542 Coronary artery dissection: Secondary | ICD-10-CM

## 2022-01-31 DIAGNOSIS — Z0000 Encounter for general adult medical examination without abnormal findings: Secondary | ICD-10-CM

## 2022-01-31 DIAGNOSIS — Z90710 Acquired absence of both cervix and uterus: Secondary | ICD-10-CM | POA: Diagnosis not present

## 2022-01-31 DIAGNOSIS — Z01419 Encounter for gynecological examination (general) (routine) without abnormal findings: Secondary | ICD-10-CM | POA: Diagnosis not present

## 2022-01-31 DIAGNOSIS — H348192 Central retinal vein occlusion, unspecified eye, stable: Secondary | ICD-10-CM

## 2022-01-31 DIAGNOSIS — Z780 Asymptomatic menopausal state: Secondary | ICD-10-CM | POA: Diagnosis not present

## 2022-01-31 NOTE — Progress Notes (Signed)
61 y.o. G90P2002 Married White or Caucasian female here for annual exam.  Doing well.  Broke toe last year.  Has some arthritis on x ray.  Had BMD 2020.  Had minimal osteopenia.  Does have some vaginal dryness.  Used some olive oil in the past.  Tried vaginal Vit E suppositories.    Denies vaginal bleeding.  Followed by Dr. Percival Spanish yearly.    Patient's last menstrual period was 05/10/2007.          Sexually active: Yes.    The current method of family planning is status post hysterectomy.    Exercising: walking Smoker:  no  Health Maintenance: Pap:  2008 History of abnormal Pap:  no MMG:  04/28/2021 Negative Colonoscopy:  02/28/2019, follow up 5 years BMD:   08/09/2018 Osteopenia, -1.1 Screening Labs: ordered today   reports that she has never smoked. She has never used smokeless tobacco. She reports that she does not drink alcohol and does not use drugs.  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   Colon polyp    Endometriosis 1989   Hyperlipidemia    Hypertension    Myocardial infarction [facility])    2013   Osteopenia    Retinal vein occlusion    Right (July 2008)    Past Surgical History:  Procedure Laterality Date   APPENDECTOMY     CESAREAN SECTION  2000, 2002   x2   COLONOSCOPY     LEFT HEART CATH N/A 08/25/2011   Procedure: LEFT HEART CATH;  Surgeon: Sherren Mocha, MD;  Location: [facility] CATH LAB;  Service: Cardiovascular;  Laterality: N/A;   POLYPECTOMY     TOTAL ABDOMINAL HYSTERECTOMY W/ BILATERAL SALPINGOOPHORECTOMY  12/08    Current Outpatient Medications  Medication Sig Dispense Refill   aspirin 81 MG tablet Take 81 mg by mouth daily.     metoprolol succinate (TOPROL-XL) 25 MG 24 hr tablet TAKE 1 TABLET(25 MG) BY MOUTH EVERY MORNING 90 tablet 2   Multiple Vitamin (MULTIVITAMIN) tablet Take 1 tablet by mouth  daily. With calcium 200 mg and vitamin D '1000mg'$      nitroGLYCERIN (NITROSTAT) 0.4 MG SL tablet PLACE 1 ONE TABLET UNDER TONGUE AS NEEDED FOR CHEST PAIN EVERY 5 MINUTES 25 tablet PRN   rosuvastatin (CRESTOR) 10 MG tablet TAKE 1 TABLET(10 MG) BY MOUTH DAILY 90 tablet 3   No current facility-administered medications for this visit.    Family History  Problem Relation Age of Onset   Hyperlipidemia Father 71   Colon polyps Father    Hyperlipidemia Mother 64   Hypertension Mother 53   Heart disease Maternal Grandmother 30       Multiple MIs   Multiple births Other        Mat. G-aunt, quadruplets   Cancer - Lung Other        grandfather   Diabetes Maternal Aunt    Stomach cancer Maternal Grandfather    Colon polyps Paternal Grandfather    Uterine cancer Paternal Grandmother    Colon cancer Neg Hx    Breast cancer Neg Hx    Esophageal cancer Neg Hx    Rectal cancer Neg Hx     ROS: Constitutional: negative Genitourinary:negative  Exam:   LMP 05/10/2007      General appearance: alert, cooperative  and appears stated age Head: Normocephalic, without obvious abnormality, atraumatic Neck: no adenopathy, supple, symmetrical, trachea midline and thyroid normal to inspection and palpation Lungs: clear to auscultation bilaterally Breasts: normal appearance, no masses or tenderness Heart: regular rate and rhythm Abdomen: soft, non-tender; bowel sounds normal; no masses,  no organomegaly Extremities: extremities normal, atraumatic, no cyanosis or edema Skin: Skin color, texture, turgor normal. No rashes or lesions Lymph nodes: Cervical, supraclavicular, and axillary nodes normal. No abnormal inguinal nodes palpated Neurologic: Grossly normal   Pelvic: External genitalia:  no lesions              Urethra:  normal appearing urethra with no masses, tenderness or lesions              Bartholins and Skenes: normal                 Vagina: normal appearing vagina with normal color and no  discharge, no lesions              Cervix: absent              Pap taken: No. Bimanual Exam:  Uterus:  uterus absent              Adnexa: no mass, fullness, tenderness               Rectovaginal: Confirms               Anus:  normal sphincter tone, no lesions  Chaperone, Octaviano Batty, CMA, was present for exam.  Assessment/Plan: 1. Well woman exam with routine gynecological exam - Pap smear not indicated - Mammogram 04/2022 - Colonoscopy 2020.  Follow up 5 years. - Bone mineral density 08/2018 - lab work done done with PCP - vaccines reviewed/updated  2. Blood tests for routine general physical examination - CBC - Comprehensive metabolic panel - Hemoglobin A1c - TSH  3. Retinal vein occlusion, unspecified laterality, unspecified retinal vein  4. H/O: hysterectomy - BSO  5. Coronary artery dissection - followed by cardiology  6. Postmenopausal - no HRT

## 2022-02-01 LAB — COMPREHENSIVE METABOLIC PANEL
ALT: 20 IU/L (ref 0–32)
AST: 21 IU/L (ref 0–40)
Albumin/Globulin Ratio: 2.4 — ABNORMAL HIGH (ref 1.2–2.2)
Albumin: 4.8 g/dL (ref 3.8–4.9)
Alkaline Phosphatase: 96 IU/L (ref 44–121)
BUN/Creatinine Ratio: 26 — ABNORMAL HIGH (ref 9–23)
BUN: 19 mg/dL (ref 6–24)
Bilirubin Total: 1 mg/dL (ref 0.0–1.2)
CO2: 23 mmol/L (ref 20–29)
Calcium: 9.8 mg/dL (ref 8.7–10.2)
Chloride: 106 mmol/L (ref 96–106)
Creatinine, Ser: 0.73 mg/dL (ref 0.57–1.00)
Globulin, Total: 2 g/dL (ref 1.5–4.5)
Glucose: 93 mg/dL (ref 70–99)
Potassium: 4.3 mmol/L (ref 3.5–5.2)
Sodium: 144 mmol/L (ref 134–144)
Total Protein: 6.8 g/dL (ref 6.0–8.5)
eGFR: 95 mL/min/{1.73_m2} (ref >59)

## 2022-02-01 LAB — CBC
Hematocrit: 46.3 % (ref 34.0–46.6)
Hemoglobin: 15.8 g/dL (ref 11.1–15.9)
MCH: 31.3 pg (ref 26.6–33.0)
MCHC: 34.1 g/dL (ref 31.5–35.7)
MCV: 92 fL (ref 79–97)
Platelets: 175 10*3/uL (ref 150–450)
RBC: 5.05 x10E6/uL (ref 3.77–5.28)
RDW: 12.2 % (ref 11.7–15.4)
WBC: 4.9 10*3/uL (ref 3.4–10.8)

## 2022-02-01 LAB — HEMOGLOBIN A1C
Est. average glucose Bld gHb Est-mCnc: 105 mg/dL
Hgb A1c MFr Bld: 5.3 % (ref 4.8–5.6)

## 2022-02-01 LAB — TSH: TSH: 1.48 u[IU]/mL (ref 0.450–4.500)

## 2022-02-06 ENCOUNTER — Encounter (HOSPITAL_BASED_OUTPATIENT_CLINIC_OR_DEPARTMENT_OTHER): Payer: Self-pay | Admitting: *Deleted

## 2022-02-16 NOTE — Progress Notes (Unsigned)
Cardiology Office Note   Date:  02/17/2022   ID:  Kaylee Banks, DOB 1960-10-07, MRN [id number]  PCP:  Shirline Frees, MD  Cardiologist:   Minus Breeding, MD   Chief Complaint  Patient presents with   SCAD     History of Present Illness: Kaylee Banks is a 61 y.o. female who presents for a follow up of SCAD.   In 2019 she had a negative POET (Plain Old Exercise Treadmill).   Since I last saw her she has had no new cardiovascular complaints.  She is doing a little bit of walking and not exercising quite as much as I would hope.  She does her housework. The patient denies any new symptoms such as chest discomfort, neck or arm discomfort. There has been no new shortness of breath, PND or orthopnea. There have been no reported palpitations, presyncope or syncope.  Now that her children are out of the house she thinks she has a room to move back to her rowing machine that she has been using.  I encouraged this.   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   Colon polyp    Endometriosis 1989   Hyperlipidemia    Hypertension    Myocardial infarction [facility])    2013   Osteopenia    Retinal vein occlusion    Right (July 2008)    Past Surgical History:  Procedure Laterality Date   APPENDECTOMY     CESAREAN SECTION  2000, 2002   x2   COLONOSCOPY     LEFT HEART CATH N/A 08/25/2011   Procedure: LEFT HEART CATH;  Surgeon: Sherren Mocha, MD;  Location: [facility] CATH LAB;  Service: Cardiovascular;  Laterality: N/A;   POLYPECTOMY     TOTAL ABDOMINAL HYSTERECTOMY W/ BILATERAL SALPINGOOPHORECTOMY  12/08     Current Outpatient Medications  Medication Sig Dispense Refill   aspirin 81 MG tablet Take 81 mg by mouth daily.     cholecalciferol (VITAMIN D3) 25 MCG (1000 UNIT) tablet Take 1,000 Units by mouth daily.     metoprolol  succinate (TOPROL-XL) 25 MG 24 hr tablet TAKE 1 TABLET(25 MG) BY MOUTH EVERY MORNING 90 tablet 2   Multiple Vitamin (MULTIVITAMIN) tablet Take 1 tablet by mouth daily. With calcium 200 mg and vitamin D '1000mg'$      nitroGLYCERIN (NITROSTAT) 0.4 MG SL tablet PLACE 1 ONE TABLET UNDER TONGUE AS NEEDED FOR CHEST PAIN EVERY 5 MINUTES 25 tablet PRN   Omega-3 Fatty Acids (FISH OIL) 1000 MG CAPS Take by mouth.     rosuvastatin (CRESTOR) 10 MG tablet TAKE 1 TABLET(10 MG) BY MOUTH DAILY 90 tablet 3   No current facility-administered medications for this visit.    Allergies:   Dilaudid [hydromorphone hcl], Hydromorphone hcl, and Oxycodone-acetaminophen    ROS:  Please see the history of present illness.   Otherwise, review of systems are positive for none.   All other systems are reviewed and negative.    PHYSICAL EXAM: VS:  BP 118/64   Pulse (!) 57   Ht '5\' 8"'$  (1.727 m)   Wt 165 lb 12.8 oz (75.2 kg)   LMP 05/10/2007   SpO2 98%   BMI 25.21 kg/m  , BMI Body mass index is 25.21 kg/m. GENERAL:  Well appearing NECK:  No jugular venous distention, waveform within normal limits, carotid upstroke brisk and symmetric, no bruits, no thyromegaly LUNGS:  Clear to auscultation bilaterally CHEST:  Unremarkable HEART:  PMI not displaced or sustained,S1 and S2 within normal limits, no S3, no S4, no clicks, no rubs, no murmurs ABD:  Flat, positive bowel sounds normal in frequency in pitch, no bruits, no rebound, no guarding, no midline pulsatile mass, no hepatomegaly, no splenomegaly EXT:  2 plus pulses throughout, no edema, no cyanosis no clubbing   EKG:  EKG is  ordered today. The ekg ordered today demonstrates   sinus rhythm, rate 57, axis within normal limits, intervals within normal limits, RSR prime V1 and V2, early transition lead V2.  No significant change from previous.   Recent Labs: 01/31/2022: ALT 20; BUN 19; Creatinine, Ser 0.73; Hemoglobin 15.8; Platelets 175; Potassium 4.3; Sodium 144; TSH  1.480    Lipid Panel    Component Value Date/Time   CHOL 157 01/10/2022 0825   TRIG 223 (H) 01/10/2022 0825   HDL 46 01/10/2022 0825   CHOLHDL 3.4 01/10/2022 0825   CHOLHDL 2.8 10/29/2015 0933   VLDL 25 10/29/2015 0933   LDLCALC 74 01/10/2022 0825      Wt Readings from Last 3 Encounters:  02/17/22 165 lb 12.8 oz (75.2 kg)  01/31/22 166 lb 3.2 oz (75.4 kg)  02/20/21 156 lb 6.4 oz (70.9 kg)      Other studies Reviewed: Additional studies/ records that were reviewed today include: Labs. Review of the above records demonstrates:  Please see elsewhere in the note.     ASSESSMENT AND PLAN:  SCAD:   She has no new symptoms.  No change in therapy.  DYSLIPIDEMIA:   She does well with the Crestor.  Her triglycerides were slightly elevated at 223 and we talked about low carbohydrate diet as a first-line.    Current medicines are reviewed at length with the patient today.  The patient no concerns regarding medicines.  The following changes have been made: None.   Labs/ tests ordered today include:   None  Orders Placed This Encounter  Procedures   EKG 12-Lead     Disposition:   FU with me in 1 year.    Signed, Minus Breeding, MD  02/17/2022 11:52 AM    [place]

## 2022-02-17 ENCOUNTER — Ambulatory Visit: Payer: BC Managed Care – PPO | Attending: Cardiology | Admitting: Cardiology

## 2022-02-17 ENCOUNTER — Encounter: Payer: Self-pay | Admitting: Cardiology

## 2022-02-17 VITALS — BP 118/64 | HR 57 | Ht 68.0 in | Wt 165.8 lb

## 2022-02-17 DIAGNOSIS — I2542 Coronary artery dissection: Secondary | ICD-10-CM | POA: Diagnosis not present

## 2022-02-17 DIAGNOSIS — E785 Hyperlipidemia, unspecified: Secondary | ICD-10-CM

## 2022-02-17 NOTE — Patient Instructions (Signed)
Medication Instructions:  Your physician recommends that you continue on your current medications as directed. Please refer to the Current Medication list given to you today.  *If you need a refill on your cardiac medications before your next appointment, please call your pharmacy*  Follow-Up: At [facility], you and your health needs are our priority.  As part of our continuing mission to provide you with exceptional heart care, we have created designated Provider Care Teams.  These Care Teams include your primary Cardiologist (physician) and Advanced Practice Providers (APPs -  Physician Assistants and Nurse Practitioners) who all work together to provide you with the care you need, when you need it.  We recommend signing up for the patient portal called "MyChart".  Sign up information is provided on this After Visit Summary.  MyChart is used to connect with patients for Virtual Visits (Telemedicine).  Patients are able to view lab/test results, encounter notes, upcoming appointments, etc.  Non-urgent messages can be sent to your provider as well.   To learn more about what you can do with MyChart, go to [email or web address].    Your next appointment:   12 month(s)  The format for your next appointment:   In Person  Provider:   James Hochrein, MD      

## 2022-02-25 ENCOUNTER — Telehealth: Payer: Self-pay | Admitting: Cardiology

## 2022-02-25 MED ORDER — METOPROLOL SUCCINATE ER 25 MG PO TB24: ORAL_TABLET | ORAL | 3 refills | Status: DC

## 2022-02-25 MED ORDER — ROSUVASTATIN CALCIUM 10 MG PO TABS: ORAL_TABLET | ORAL | 3 refills | Status: DC

## 2022-02-25 MED ORDER — NITROGLYCERIN 0.4 MG SL SUBL: SUBLINGUAL_TABLET | SUBLINGUAL | 99 refills | Status: DC

## 2022-02-25 NOTE — Telephone Encounter (Signed)
*  STAT* If patient is at the pharmacy, call can be transferred to refill team.   1. Which medications need to be refilled? (please list name of each medication and dose if known)   metoprolol succinate (TOPROL-XL) 25 MG 24 hr tablet rosuvastatin (CRESTOR) 10 MG tablet nitroGLYCERIN (NITROSTAT) 0.4 MG SL tablet  2. Which pharmacy/location (including street and city if local pharmacy) is medication to be sent to? [facility] [address] [place], [place]. AT [place]  3. Do they need a 30 day or 90 day supply? 90 day

## 2022-04-16 DIAGNOSIS — H43811 Vitreous degeneration, right eye: Secondary | ICD-10-CM | POA: Diagnosis not present

## 2022-04-16 DIAGNOSIS — H3500 Unspecified background retinopathy: Secondary | ICD-10-CM | POA: Diagnosis not present

## 2022-04-21 ENCOUNTER — Ambulatory Visit
Admission: RE | Admit: 2022-04-21 | Discharge: 2022-04-21 | Disposition: A | Discharge status: Home / Self Care | Payer: BC Managed Care – PPO | Source: Ambulatory Visit | Attending: Obstetrics & Gynecology | Admitting: Obstetrics & Gynecology

## 2022-04-21 DIAGNOSIS — Z1231 Encounter for screening mammogram for malignant neoplasm of breast: Secondary | ICD-10-CM

## 2022-04-23 ENCOUNTER — Other Ambulatory Visit: Payer: Self-pay | Admitting: Obstetrics & Gynecology

## 2022-04-23 DIAGNOSIS — R928 Other abnormal and inconclusive findings on diagnostic imaging of breast: Secondary | ICD-10-CM

## 2022-04-30 DIAGNOSIS — H3500 Unspecified background retinopathy: Secondary | ICD-10-CM | POA: Diagnosis not present

## 2022-04-30 DIAGNOSIS — H43811 Vitreous degeneration, right eye: Secondary | ICD-10-CM | POA: Diagnosis not present

## 2022-04-30 DIAGNOSIS — H31001 Unspecified chorioretinal scars, right eye: Secondary | ICD-10-CM | POA: Diagnosis not present

## 2022-05-07 DIAGNOSIS — H43391 Other vitreous opacities, right eye: Secondary | ICD-10-CM | POA: Diagnosis not present

## 2022-05-07 DIAGNOSIS — H34831 Tributary (branch) retinal vein occlusion, right eye, with macular edema: Secondary | ICD-10-CM | POA: Diagnosis not present

## 2022-05-07 DIAGNOSIS — H33311 Horseshoe tear of retina without detachment, right eye: Secondary | ICD-10-CM | POA: Diagnosis not present

## 2022-05-07 DIAGNOSIS — H4311 Vitreous hemorrhage, right eye: Secondary | ICD-10-CM | POA: Diagnosis not present

## 2022-05-08 DIAGNOSIS — H33311 Horseshoe tear of retina without detachment, right eye: Secondary | ICD-10-CM | POA: Diagnosis not present

## 2022-05-09 ENCOUNTER — Ambulatory Visit
Admission: RE | Admit: 2022-05-09 | Discharge: 2022-05-09 | Disposition: A | Discharge status: Home / Self Care | Payer: BC Managed Care – PPO | Source: Ambulatory Visit | Attending: Obstetrics & Gynecology | Admitting: Obstetrics & Gynecology

## 2022-05-09 DIAGNOSIS — R928 Other abnormal and inconclusive findings on diagnostic imaging of breast: Secondary | ICD-10-CM

## 2022-05-12 DIAGNOSIS — H3561 Retinal hemorrhage, right eye: Secondary | ICD-10-CM | POA: Diagnosis not present

## 2022-05-12 DIAGNOSIS — H3509 Other intraretinal microvascular abnormalities: Secondary | ICD-10-CM | POA: Diagnosis not present

## 2022-05-12 DIAGNOSIS — H3582 Retinal ischemia: Secondary | ICD-10-CM | POA: Diagnosis not present

## 2022-05-12 DIAGNOSIS — H34831 Tributary (branch) retinal vein occlusion, right eye, with macular edema: Secondary | ICD-10-CM | POA: Diagnosis not present

## 2022-05-19 DIAGNOSIS — H53131 Sudden visual loss, right eye: Secondary | ICD-10-CM | POA: Diagnosis not present

## 2022-05-19 DIAGNOSIS — H3582 Retinal ischemia: Secondary | ICD-10-CM | POA: Diagnosis not present

## 2022-05-19 DIAGNOSIS — H4311 Vitreous hemorrhage, right eye: Secondary | ICD-10-CM | POA: Diagnosis not present

## 2022-05-19 DIAGNOSIS — H43391 Other vitreous opacities, right eye: Secondary | ICD-10-CM | POA: Diagnosis not present

## 2022-05-20 DIAGNOSIS — H4311 Vitreous hemorrhage, right eye: Secondary | ICD-10-CM | POA: Diagnosis not present

## 2022-05-29 DIAGNOSIS — H34831 Tributary (branch) retinal vein occlusion, right eye, with macular edema: Secondary | ICD-10-CM | POA: Diagnosis not present

## 2022-05-29 DIAGNOSIS — H3561 Retinal hemorrhage, right eye: Secondary | ICD-10-CM | POA: Diagnosis not present

## 2022-05-29 DIAGNOSIS — H3582 Retinal ischemia: Secondary | ICD-10-CM | POA: Diagnosis not present

## 2022-05-29 DIAGNOSIS — H25813 Combined forms of age-related cataract, bilateral: Secondary | ICD-10-CM | POA: Diagnosis not present

## 2022-07-04 DIAGNOSIS — H43391 Other vitreous opacities, right eye: Secondary | ICD-10-CM | POA: Diagnosis not present

## 2022-07-04 DIAGNOSIS — H43811 Vitreous degeneration, right eye: Secondary | ICD-10-CM | POA: Diagnosis not present

## 2022-07-04 DIAGNOSIS — H53141 Visual discomfort, right eye: Secondary | ICD-10-CM | POA: Diagnosis not present

## 2022-07-04 DIAGNOSIS — H34831 Tributary (branch) retinal vein occlusion, right eye, with macular edema: Secondary | ICD-10-CM | POA: Diagnosis not present

## 2022-07-07 DIAGNOSIS — H34831 Tributary (branch) retinal vein occlusion, right eye, with macular edema: Secondary | ICD-10-CM | POA: Diagnosis not present

## 2022-07-28 DIAGNOSIS — H31001 Unspecified chorioretinal scars, right eye: Secondary | ICD-10-CM | POA: Diagnosis not present

## 2022-07-28 DIAGNOSIS — H43813 Vitreous degeneration, bilateral: Secondary | ICD-10-CM | POA: Diagnosis not present

## 2022-08-11 DIAGNOSIS — H43312 Vitreous membranes and strands, left eye: Secondary | ICD-10-CM | POA: Diagnosis not present

## 2022-08-11 DIAGNOSIS — H5319 Other subjective visual disturbances: Secondary | ICD-10-CM | POA: Diagnosis not present

## 2022-08-11 DIAGNOSIS — H34831 Tributary (branch) retinal vein occlusion, right eye, with macular edema: Secondary | ICD-10-CM | POA: Diagnosis not present

## 2022-08-11 DIAGNOSIS — H53142 Visual discomfort, left eye: Secondary | ICD-10-CM | POA: Diagnosis not present

## 2022-09-12 DIAGNOSIS — H34831 Tributary (branch) retinal vein occlusion, right eye, with macular edema: Secondary | ICD-10-CM | POA: Diagnosis not present

## 2023-01-07 ENCOUNTER — Other Ambulatory Visit: Payer: Self-pay | Admitting: Obstetrics & Gynecology

## 2023-01-07 DIAGNOSIS — Z1231 Encounter for screening mammogram for malignant neoplasm of breast: Secondary | ICD-10-CM

## 2023-02-13 ENCOUNTER — Encounter (HOSPITAL_BASED_OUTPATIENT_CLINIC_OR_DEPARTMENT_OTHER): Payer: Self-pay | Admitting: Obstetrics & Gynecology

## 2023-02-13 ENCOUNTER — Ambulatory Visit (HOSPITAL_BASED_OUTPATIENT_CLINIC_OR_DEPARTMENT_OTHER): Payer: Managed Care, Other (non HMO) | Admitting: Obstetrics & Gynecology

## 2023-02-13 VITALS — BP 133/75 | HR 57 | Ht 68.0 in | Wt 168.0 lb

## 2023-02-13 DIAGNOSIS — Z90710 Acquired absence of both cervix and uterus: Secondary | ICD-10-CM | POA: Diagnosis not present

## 2023-02-13 DIAGNOSIS — M8580 Other specified disorders of bone density and structure, unspecified site: Secondary | ICD-10-CM | POA: Diagnosis not present

## 2023-02-13 DIAGNOSIS — Z01419 Encounter for gynecological examination (general) (routine) without abnormal findings: Secondary | ICD-10-CM

## 2023-02-13 DIAGNOSIS — I2542 Coronary artery dissection: Secondary | ICD-10-CM | POA: Diagnosis not present

## 2023-02-13 DIAGNOSIS — Z780 Asymptomatic menopausal state: Secondary | ICD-10-CM

## 2023-02-13 NOTE — Patient Instructions (Signed)
Call [phone] to scheduled at [facility], [address], [address].  [place], [place] [address]   

## 2023-02-13 NOTE — Progress Notes (Signed)
62 y.o. G69P2002 Married White or Caucasian female here for annual exam.  Had had a lot more issues with retina this year.  Had retinal vein occlusion in 2008 and does have some blurry vision.  This year she dealt with a retinal tear and vitreous hemorrhage.  Had two eye procedures this year.  But vision is stable.    She does see Dr. Antoine Poche every year.    Denies vaginal bleeding.    Patient's last menstrual period was 05/10/2007.          Sexually active: Yes.    The current method of family planning is status post hysterectomy.    Exercising: Yes.     Smoker:  no  Health Maintenance: Pap:  not indicated, h/o hysterectomy History of abnormal Pap:  no MMG:  04/21/2022 Colonoscopy:  02/28/2019, follow up 1 year BMD:   2020, t score -1.1 Screening Labs: done 01/2022.  Will do blood work with NP at his office next month.   reports that she has never smoked. She has never used smokeless tobacco. She reports that she does not drink alcohol and does not use drugs.  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 CATH LAB;  Service: Cardiovascular;  Laterality: N/A;   POLYPECTOMY     TOTAL ABDOMINAL HYSTERECTOMY W/ BILATERAL SALPINGOOPHORECTOMY  12/08    Current Outpatient Medications  Medication Sig Dispense Refill   aspirin 81 MG tablet Take 81 mg by mouth daily.     cholecalciferol (VITAMIN D3) 25 MCG (1000 UNIT) tablet Take 1,000 Units by  mouth daily.     metoprolol succinate (TOPROL-XL) 25 MG 24 hr tablet TAKE 1 TABLET(25 MG) BY MOUTH EVERY MORNING 90 tablet 3   Multiple Vitamin (MULTIVITAMIN) tablet Take 1 tablet by mouth daily. With calcium 200 mg and vitamin D 1000mg      nitroGLYCERIN (NITROSTAT) 0.4 MG SL tablet PLACE 1 ONE TABLET UNDER TONGUE AS NEEDED FOR CHEST PAIN EVERY 5 MINUTES 25 tablet PRN   Omega-3 Fatty Acids (FISH OIL) 1000 MG CAPS Take by mouth.     rosuvastatin (CRESTOR) 10 MG tablet TAKE 1 TABLET(10 MG) BY MOUTH DAILY 90 tablet 3   No current facility-administered medications for this visit.    Family History  Problem Relation Age of Onset   Hyperlipidemia Father 29   Colon polyps Father    Hyperlipidemia Mother 81   Hypertension Mother 33   Heart disease Maternal Grandmother 60       Multiple MIs   Multiple births Other        Mat. G-aunt, quadruplets   Cancer - Lung Other        grandfather   Diabetes Maternal Aunt    Stomach cancer Maternal Grandfather    Colon  polyps Paternal Grandfather    Uterine cancer Paternal Grandmother    Colon cancer Neg Hx    Breast cancer Neg Hx    Esophageal cancer Neg Hx    Rectal cancer Neg Hx     ROS: Constitutional: negative Genitourinary:negative  Exam:   BP 133/75 (BP Location: Right Arm, Patient Position: Sitting, Cuff Size: Normal)   Pulse (!) 57   Ht 5\' 8"  (1.727 m)   Wt 168 lb (76.2 kg)   LMP 05/10/2007   BMI 25.54 kg/m   Height: 5\' 8"  (172.7 cm)  General appearance: alert, cooperative and appears stated age Head: Normocephalic, without obvious abnormality, atraumatic Neck: no adenopathy, supple, symmetrical, trachea midline and thyroid normal to inspection and palpation Lungs: clear to auscultation bilaterally Breasts: normal appearance, no masses or tenderness Heart: regular rate and rhythm Abdomen: soft, non-tender; bowel sounds normal; no masses,  no organomegaly Extremities: extremities normal, atraumatic, no cyanosis or edema Skin:  Skin color, texture, turgor normal. No rashes or lesions Lymph nodes: Cervical, supraclavicular, and axillary nodes normal. No abnormal inguinal nodes palpated Neurologic: Grossly normal   Pelvic: External genitalia:  no lesions              Urethra:  normal appearing urethra with no masses, tenderness or lesions              Bartholins and Skenes: normal                 Vagina: normal appearing vagina with normal color and no discharge, no lesions              Cervix: absent              Pap taken: No. Bimanual Exam:  Uterus:  uterus absent              Adnexa: no mass, fullness, tenderness               Rectovaginal: Confirms               Anus:  normal sphincter tone, no lesions  Chaperone, Ina Homes, CMA, was present for exam.  Assessment/Plan: 1. Well woman exam with routine gynecological exam - Pap smear not indicated - Mammogram 04/2022.  Scheduled for November - Colonoscopy 2020.  Follow up 5 years - Bone mineral density ordered - lab work will be done with PCP - vaccines reviewed/updated  2. 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 H/O: hysterectomy  4. Osteopenia, unspecified location - DG BONE DENSITY (DXA); Future  5. Postmenopausal - not on HRT

## 2023-02-19 ENCOUNTER — Other Ambulatory Visit (HOSPITAL_BASED_OUTPATIENT_CLINIC_OR_DEPARTMENT_OTHER): Payer: Self-pay | Admitting: Obstetrics & Gynecology

## 2023-02-19 DIAGNOSIS — M8580 Other specified disorders of bone density and structure, unspecified site: Secondary | ICD-10-CM

## 2023-02-23 NOTE — Progress Notes (Unsigned)
Cardiology Office Note:   Date:  02/25/2023  ID:  Kaylee Banks, DOB 27-Mar-1961, MRN [id number] PCP: Noberto Retort, MD  [facility] Providers Cardiologist:  Rollene Rotunda, MD {  History of Present Illness:   Kaylee Banks is a 62 y.o. female who presents for a follow up of SCAD.   In 2019 she had a negative POET (Plain Old Exercise Treadmill).     The patient denies any new symptoms such as chest discomfort, neck or arm discomfort. There has been no new shortness of breath, PND or orthopnea. There have been no reported palpitations, presyncope or syncope.  She still has one child at home and doesn't yet have room for the rowing  machine...but soon.  She does walk.  The patient denies any new symptoms such as chest discomfort, neck or arm discomfort. There has been no new shortness of breath, PND or orthopnea. There have been no reported palpitations, presyncope or syncope.    ROS: As stated in the HPI and negative for all other systems.  Studies Reviewed:    EKG:   EKG Interpretation Date/Time:  Wednesday February 25 2023 08:47:36 EDT Ventricular Rate:  54 PR Interval:  144 QRS Duration:  86 QT Interval:  436 QTC Calculation: 413 R Axis:   -5  Text Interpretation: Sinus bradycardia When compared with ECG of 25-Aug-2011 07:05, No significant change since last tracing Confirmed by Rollene Rotunda ([id number]) on 02/25/2023 9:05:19 AM    Risk Assessment/Calculations:     Physical Exam:   VS:  BP 112/76 (BP Location: Left Arm, Patient Position: Sitting, Cuff Size: Normal)   Pulse (!) 54   Ht 5\' 8"  (1.727 m)   Wt 156 lb (70.8 kg)   LMP 05/10/2007   SpO2 96%   BMI 23.72 kg/m    Wt Readings from Last 3 Encounters:  02/25/23 156 lb (70.8 kg)  02/13/23 168 lb (76.2 kg)  02/17/22 165 lb 12.8 oz (75.2 kg)     GEN: Well nourished, well developed in no acute distress NECK: No JVD; No carotid bruits CARDIAC: RRR, no murmurs, rubs, gallops RESPIRATORY:  Clear to  auscultation without rales, wheezing or rhonchi  ABDOMEN: Soft, non-tender, non-distended EXTREMITIES:  No edema; No deformity   ASSESSMENT AND PLAN:   SCAD:   The patient has no new sypmtoms.  No further cardiovascular testing is indicated.  We will continue with aggressive risk reduction and meds as listed.   DYSLIPIDEMIA:   LDL is 74 but this was 2023.  She is going to get that checked again this year and I told her if it is on its way up we need to talk about this.  Does her anemia and we need she has had some high triglycerides as well and she will let me know what these are.  We talked about carb modified diet.       Follow up with me in one year.   Signed, Rollene Rotunda, MD

## 2023-02-25 ENCOUNTER — Encounter: Payer: Self-pay | Admitting: Cardiology

## 2023-02-25 ENCOUNTER — Ambulatory Visit: Payer: Managed Care, Other (non HMO) | Attending: Cardiology | Admitting: Cardiology

## 2023-02-25 VITALS — BP 112/76 | HR 54 | Ht 68.0 in | Wt 156.0 lb

## 2023-02-25 DIAGNOSIS — E785 Hyperlipidemia, unspecified: Secondary | ICD-10-CM | POA: Diagnosis not present

## 2023-02-25 DIAGNOSIS — I2542 Coronary artery dissection: Secondary | ICD-10-CM | POA: Diagnosis not present

## 2023-02-25 NOTE — Patient Instructions (Signed)
Medication Instructions:  STOP omega-3  *If you need a refill on your cardiac medications before your next appointment, please call your pharmacy*  Follow-Up: At [facility], you and your health needs are our priority.  As part of our continuing mission to provide you with exceptional heart care, we have created designated Provider Care Teams.  These Care Teams include your primary Cardiologist (physician) and Advanced Practice Providers (APPs -  Physician Assistants and Nurse Practitioners) who all work together to provide you with the care you need, when you need it.  We recommend signing up for the patient portal called "MyChart".  Sign up information is provided on this After Visit Summary.  MyChart is used to connect with patients for Virtual Visits (Telemedicine).  Patients are able to view lab/test results, encounter notes, upcoming appointments, etc.  Non-urgent messages can be sent to your provider as well.   To learn more about what you can do with MyChart, go to [email or web address].    Your next appointment:   12 months with Dr. Antoine Poche

## 2023-03-26 ENCOUNTER — Other Ambulatory Visit: Payer: Self-pay | Admitting: Cardiology

## 2023-04-27 ENCOUNTER — Ambulatory Visit
Admission: RE | Admit: 2023-04-27 | Discharge: 2023-04-27 | Disposition: A | Discharge status: Home / Self Care | Payer: Managed Care, Other (non HMO) | Source: Ambulatory Visit | Attending: Obstetrics & Gynecology | Admitting: Obstetrics & Gynecology

## 2023-04-27 DIAGNOSIS — Z1231 Encounter for screening mammogram for malignant neoplasm of breast: Secondary | ICD-10-CM

## 2023-06-08 ENCOUNTER — Other Ambulatory Visit: Payer: Self-pay | Admitting: Cardiology

## 2023-08-27 ENCOUNTER — Ambulatory Visit
Admission: RE | Admit: 2023-08-27 | Discharge: 2023-08-27 | Disposition: A | Discharge status: Home / Self Care | Payer: Managed Care, Other (non HMO) | Source: Ambulatory Visit | Attending: Obstetrics & Gynecology | Admitting: Obstetrics & Gynecology

## 2023-08-27 DIAGNOSIS — M8580 Other specified disorders of bone density and structure, unspecified site: Secondary | ICD-10-CM

## 2023-08-28 ENCOUNTER — Encounter (HOSPITAL_BASED_OUTPATIENT_CLINIC_OR_DEPARTMENT_OTHER): Payer: Self-pay | Admitting: Obstetrics & Gynecology

## 2023-09-17 ENCOUNTER — Other Ambulatory Visit: Payer: Self-pay | Admitting: Obstetrics & Gynecology

## 2023-09-17 DIAGNOSIS — Z1231 Encounter for screening mammogram for malignant neoplasm of breast: Secondary | ICD-10-CM

## 2023-12-25 ENCOUNTER — Encounter: Payer: Self-pay | Admitting: Gastroenterology

## 2024-01-23 ENCOUNTER — Other Ambulatory Visit: Payer: Self-pay | Admitting: Cardiology

## 2024-02-04 ENCOUNTER — Ambulatory Visit (AMBULATORY_SURGERY_CENTER)

## 2024-02-04 VITALS — Ht 68.0 in | Wt 164.0 lb

## 2024-02-04 DIAGNOSIS — Z860100 Personal history of colon polyps, unspecified: Secondary | ICD-10-CM

## 2024-02-04 MED ORDER — NA SULFATE-K SULFATE-MG SULF 17.5-3.13-1.6 GM/177ML PO SOLN: 1.0000 | Freq: Once | ORAL | 0 refills | Status: AC

## 2024-02-04 NOTE — Progress Notes (Signed)

## 2024-02-09 ENCOUNTER — Encounter: Payer: Self-pay | Admitting: Gastroenterology

## 2024-02-12 ENCOUNTER — Other Ambulatory Visit: Payer: Self-pay | Admitting: Medical Genetics

## 2024-02-18 ENCOUNTER — Encounter: Payer: Self-pay | Admitting: Gastroenterology

## 2024-02-18 ENCOUNTER — Ambulatory Visit: Admitting: Gastroenterology

## 2024-02-18 VITALS — BP 115/71 | HR 63 | Temp 97.3°F | Resp 11 | Ht 68.0 in | Wt 164.0 lb

## 2024-02-18 DIAGNOSIS — Z1211 Encounter for screening for malignant neoplasm of colon: Secondary | ICD-10-CM | POA: Diagnosis present

## 2024-02-18 DIAGNOSIS — K6289 Other specified diseases of anus and rectum: Secondary | ICD-10-CM

## 2024-02-18 DIAGNOSIS — Q439 Congenital malformation of intestine, unspecified: Secondary | ICD-10-CM | POA: Diagnosis not present

## 2024-02-18 DIAGNOSIS — Z860101 Personal history of adenomatous and serrated colon polyps: Secondary | ICD-10-CM | POA: Diagnosis not present

## 2024-02-18 DIAGNOSIS — K648 Other hemorrhoids: Secondary | ICD-10-CM

## 2024-02-18 DIAGNOSIS — D123 Benign neoplasm of transverse colon: Secondary | ICD-10-CM

## 2024-02-18 DIAGNOSIS — Z860100 Personal history of colon polyps, unspecified: Secondary | ICD-10-CM

## 2024-02-18 MED ORDER — SODIUM CHLORIDE 0.9 % IV SOLN: 500.0000 mL | Freq: Once | INTRAVENOUS | Status: DC

## 2024-02-18 NOTE — Op Note (Signed)
 [facility] Patient Name: Kaylee Banks Procedure Date: 02/18/2024 8:35 AM MRN: [id number] Endoscopist: Elspeth P. Leigh , MD, [id number] Age: 63 Referring MD:  Date of Birth: 05/29/1961 Gender: Female Account #: [id number] Procedure:                Colonoscopy Indications:              High risk colon cancer surveillance: Personal                            history of colonic polyps - adenoma and SSP removed                            02/2019 Medicines:                Monitored Anesthesia Care Procedure:                Pre-Anesthesia Assessment:                           - Prior to the procedure, a History and Physical                            was performed, and patient medications and                            allergies were reviewed. The patient's tolerance of                            previous anesthesia was also reviewed. The risks                            and benefits of the procedure and the sedation                            options and risks were discussed with the patient.                            All questions were answered, and informed consent                            was obtained. Prior Anticoagulants: The patient has                            taken no anticoagulant or antiplatelet agents. ASA                            Grade Assessment: II - A patient with mild systemic                            disease. After reviewing the risks and benefits,                            the patient was deemed in satisfactory condition to  undergo the procedure.                           After obtaining informed consent, the colonoscope                            was passed under direct vision. Throughout the                            procedure, the patient's blood pressure, pulse, and                            oxygen saturations were monitored continuously. The                            PCF-HQ190L Colonoscope [id number] was  introduced                            through the anus and advanced to the the cecum,                            identified by appendiceal orifice and ileocecal                            valve. The colonoscopy was performed without                            difficulty. The patient tolerated the procedure                            well. The quality of the bowel preparation was                            good. The ileocecal valve, appendiceal orifice, and                            rectum were photographed. Scope In: 8:42:42 AM Scope Out: 9:01:33 AM Scope Withdrawal Time: 0 hours 11 minutes 25 seconds  Total Procedure Duration: 0 hours 18 minutes 51 seconds  Findings:                 The perianal and digital rectal examinations were                            normal.                           A 3 mm polyp was found in the transverse colon. The                            polyp was sessile. The polyp was removed with a                            cold snare. Resection and retrieval were complete.  The colon was tortuous.                           Anal papilla(e) were hypertrophied.                           Internal hemorrhoids were found during                            retroflexion. The hemorrhoids were small.                           The exam was otherwise without abnormality. Complications:            No immediate complications. Estimated blood loss:                            Minimal. Estimated Blood Loss:     Estimated blood loss was minimal. Impression:               - One 3 mm polyp in the transverse colon, removed                            with a cold snare. Resected and retrieved.                           - Tortuous colon.                           - Anal papilla(e) were hypertrophied.                           - Internal hemorrhoids.                           - The examination was otherwise normal. Recommendation:           - Patient has a contact  number available for                            emergencies. The signs and symptoms of potential                            delayed complications were discussed with the                            patient. Return to normal activities tomorrow.                            Written discharge instructions were provided to the                            patient.                           - Resume previous diet.                           -  Continue present medications.                           - Await pathology results. Elspeth P. Nayali Talerico, MD 02/18/2024 9:11:51 AM This report has been signed electronically.

## 2024-02-18 NOTE — Patient Instructions (Signed)

## 2024-02-18 NOTE — Progress Notes (Signed)
 Pt's states no medical or surgical changes since previsit or office visit.

## 2024-02-18 NOTE — Progress Notes (Signed)
 [facility] History and Physical   Primary Care Physician:  Arloa Elsie SAUNDERS, MD   Reason for Procedure:   History of colon polyps  Plan:    colonoscopy     HPI: Kaylee Banks is a 63 y.o. female  here for colonoscopy surveillance - adenoma and SSP removed 02/2019.   Patient denies any bowel symptoms at this time. No family history of colon cancer known. Otherwise feels well without any cardiopulmonary symptoms.   I have discussed risks / benefits of anesthesia and endoscopic procedure with Rollo DELENA Na and they wish to proceed with the exams as outlined today.    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   Colon polyp    Endometriosis 1989   Hyperlipidemia    Hypertension    Myocardial infarction [facility])    2013   Osteopenia    Retinal vein occlusion    Right (July 2008)    Past Surgical History:  Procedure Laterality Date   APPENDECTOMY     CESAREAN SECTION  2000, 2002   x2   COLONOSCOPY     LEFT HEART CATH N/A 08/25/2011   Procedure: LEFT HEART CATH;  Surgeon: Ozell Fell, MD;  Location: [facility] CATH LAB;  Service: Cardiovascular;  Laterality: N/A;   POLYPECTOMY     TOTAL ABDOMINAL HYSTERECTOMY W/ BILATERAL SALPINGOOPHORECTOMY  12/08    Prior to Admission medications   Medication Sig Start Date End Date Taking? Authorizing Provider  aspirin  81 MG tablet Take 81 mg by mouth daily.   Yes [provider]  cholecalciferol  (VITAMIN D3) 25 MCG (1000 UNIT) tablet Take 1,000 Units by mouth daily.   Yes [provider]  Coenzyme Q10 200 MG capsule Take 200 mg by mouth daily.   Yes [provider]  metoprolol  succinate (TOPROL -XL) 25 MG 24 hr tablet TAKE ONE TABLET BY MOUTH EVERY MORNING 03/26/23  Yes Lavona Agent, MD  Multiple Vitamin (MULTIVITAMIN) tablet Take 1 tablet by  mouth daily. With calcium  200 mg and vitamin D  1000mg    Yes [provider]  rosuvastatin  (CRESTOR ) 10 MG tablet TAKE ONE TABLET BY MOUTH ONE TIME DAILY 06/08/23  Yes Lavona Agent, MD  nitroGLYCERIN  (NITROSTAT ) 0.4 MG SL tablet PLACE ONE TABLET UNDER THE TONGUE AS NEEDED FOR CHEST PAIN EVERY 5 MINUTES AS DIRECTED 01/25/24   Alvan Dorn FALCON, MD    Current Outpatient Medications  Medication Sig Dispense Refill   aspirin  81 MG tablet Take 81 mg by mouth daily.     cholecalciferol  (VITAMIN D3) 25 MCG (1000 UNIT) tablet Take 1,000 Units by mouth daily.     Coenzyme Q10 200 MG capsule Take 200 mg by mouth daily.     metoprolol  succinate (TOPROL -XL) 25 MG 24 hr tablet TAKE ONE TABLET BY MOUTH EVERY MORNING 90 tablet 3   Multiple Vitamin (MULTIVITAMIN) tablet Take 1 tablet by mouth daily. With calcium  200 mg and vitamin D  1000mg      rosuvastatin  (CRESTOR ) 10 MG tablet TAKE ONE TABLET BY MOUTH ONE TIME DAILY 90 tablet 2   nitroGLYCERIN  (NITROSTAT ) 0.4 MG SL tablet PLACE ONE TABLET UNDER THE TONGUE AS NEEDED FOR CHEST PAIN EVERY 5 MINUTES AS DIRECTED 25 tablet 0   Current Facility-Administered Medications  Medication Dose Route Frequency Provider Last Rate Last Admin  0.9 %  sodium chloride  infusion  500 mL Intravenous Once Laurell Coalson, Elspeth SQUIBB, MD        Allergies as of 02/18/2024 - Review Complete 02/18/2024  Allergen Reaction Noted   Dilaudid [hydromorphone hcl] Nausea And Vomiting 10/31/2015   Hydromorphone hcl Nausea And Vomiting    Oxycodone-acetaminophen  Itching     Family History  Problem Relation Age of Onset   Hyperlipidemia Father 76   Colon polyps Father    Hyperlipidemia Mother 46   Hypertension Mother 40   Heart disease Maternal Grandmother 66       Multiple MIs   Multiple births Other        Mat. G-aunt, quadruplets   Cancer - Lung Other        grandfather   Diabetes Maternal Aunt    Stomach cancer Maternal Grandfather    Colon polyps Paternal Grandfather     Uterine cancer Paternal Grandmother    Colon cancer Neg Hx    Breast cancer Neg Hx    Esophageal cancer Neg Hx    Rectal cancer Neg Hx     Social History   Socioeconomic History   Marital status: Married    Spouse name: Not on file   Number of children: Not on file   Years of education: Not on file   Highest education level: Not on file  Occupational History   Occupation: Financial controller: [employer]  Tobacco Use   Smoking status: Never   Smokeless tobacco: Never  Vaping Use   Vaping status: Never Used  Substance and Sexual Activity   Alcohol use: No   Drug use: No   Sexual activity: Yes    Partners: Male    Birth control/protection: Surgical    Comment: hysterectomy  Other Topics Concern   Not on file  Social History Narrative   Lives at home with husband and two children      Social Drivers of Corporate investment banker Strain: Not on file  Food Insecurity: Not on file  Transportation Needs: Not on file  Physical Activity: Not on file  Stress: Not on file  Social Connections: Not on file  Intimate Partner Violence: Not on file    Review of Systems: All other review of systems negative except as mentioned in the HPI.  Physical Exam: Vital signs BP 131/84   Pulse 64   Temp (!) 97.3 F (36.3 C) (Temporal)   Ht 5' 8 (1.727 m)   Wt 164 lb (74.4 kg)   LMP 05/10/2007   SpO2 98%   BMI 24.94 kg/m   General:   Alert,  Well-developed, pleasant and cooperative in NAD Lungs:  Clear throughout to auscultation.   Heart:  Regular rate and rhythm Abdomen:  Soft, nontender and nondistended.   Neuro/Psych:  Alert and cooperative. Normal mood and affect. A and O x 3  Marcey Naval, MD [facility]

## 2024-02-18 NOTE — Progress Notes (Signed)
 Called to room to assist during endoscopic procedure.  Patient ID and intended procedure confirmed with present staff. Received instructions for my participation in the procedure from the performing physician.

## 2024-02-18 NOTE — Progress Notes (Signed)
 Transferred to PACU via stretcher, arousing, VSS.

## 2024-02-19 ENCOUNTER — Telehealth: Payer: Self-pay

## 2024-02-19 NOTE — Telephone Encounter (Signed)
  Follow up Call-     02/18/2024    7:49 AM  Call back number  Post procedure Call Back phone  # [phone]  Permission to leave phone message Yes     Patient questions:  Do you have a fever, pain , or abdominal swelling? No. Pain Score  0 *  Have you tolerated food without any problems? Yes.    Have you been able to return to your normal activities? Yes.    Do you have any questions about your discharge instructions: Diet   No. Medications  No. Follow up visit  No.  Do you have questions or concerns about your Care? No.  Actions: * If pain score is 4 or above: No action needed, pain <4.

## 2024-02-22 LAB — SURGICAL PATHOLOGY

## 2024-02-23 ENCOUNTER — Encounter: Payer: Self-pay | Admitting: Gastroenterology

## 2024-02-23 ENCOUNTER — Ambulatory Visit: Payer: Self-pay | Admitting: Gastroenterology

## 2024-02-23 ENCOUNTER — Other Ambulatory Visit (HOSPITAL_COMMUNITY)
Admission: RE | Admit: 2024-02-23 | Discharge: 2024-02-23 | Disposition: A | Discharge status: Home / Self Care | Payer: Self-pay | Source: Ambulatory Visit | Attending: Medical Genetics | Admitting: Medical Genetics

## 2024-02-29 ENCOUNTER — Encounter (HOSPITAL_BASED_OUTPATIENT_CLINIC_OR_DEPARTMENT_OTHER): Payer: Self-pay | Admitting: Obstetrics & Gynecology

## 2024-02-29 ENCOUNTER — Ambulatory Visit (HOSPITAL_BASED_OUTPATIENT_CLINIC_OR_DEPARTMENT_OTHER): Payer: Managed Care, Other (non HMO) | Admitting: Obstetrics & Gynecology

## 2024-02-29 VITALS — BP 121/89 | HR 65 | Ht 68.0 in | Wt 165.4 lb

## 2024-02-29 DIAGNOSIS — M8580 Other specified disorders of bone density and structure, unspecified site: Secondary | ICD-10-CM | POA: Insufficient documentation

## 2024-02-29 DIAGNOSIS — M8588 Other specified disorders of bone density and structure, other site: Secondary | ICD-10-CM

## 2024-02-29 DIAGNOSIS — E559 Vitamin D deficiency, unspecified: Secondary | ICD-10-CM | POA: Insufficient documentation

## 2024-02-29 DIAGNOSIS — Z01419 Encounter for gynecological examination (general) (routine) without abnormal findings: Secondary | ICD-10-CM | POA: Diagnosis not present

## 2024-02-29 DIAGNOSIS — Z90710 Acquired absence of both cervix and uterus: Secondary | ICD-10-CM | POA: Diagnosis not present

## 2024-02-29 DIAGNOSIS — H348192 Central retinal vein occlusion, unspecified eye, stable: Secondary | ICD-10-CM | POA: Insufficient documentation

## 2024-02-29 DIAGNOSIS — I214 Non-ST elevation (NSTEMI) myocardial infarction: Secondary | ICD-10-CM

## 2024-02-29 NOTE — Progress Notes (Signed)
   ANNUAL EXAM Patient name: Kaylee Banks MRN [id number]  Date of birth: 1960/12/28 Chief Complaint:   AEX  History of Present Illness:   Kaylee Banks is a 63 y.o. G45P2002 Caucasian female being seen today for a routine annual exam.   Doing well.  Denies vaginal bleeding.    Patient's last menstrual period was 05/10/2007.   Last pap Hysterectomy.  Last mammogram: 04/27/2023. Results were: normal. Pt is scheduled for mammogram on 04/27/2024. Family h/o breast cancer: no Last colonoscopy: 02/18/2024. Results:  Family h/o colorectal cancer: no     02/29/2024    8:14 AM 01/31/2022    9:40 AM 01/28/2021    3:21 PM 12/05/2011    9:06 AM 09/15/2011   10:38 AM  Depression screen PHQ 2/9  Decreased Interest 0 0 0 0 0  Down, Depressed, Hopeless 0 0 0 0 0  PHQ - 2 Score 0 0 0 0  0      Data saved with a previous flowsheet row definition    Review of Systems:   Pertinent items are noted in HPI Denies any urinary or bowel changes.  Denies pelvic pain. Pertinent History Reviewed:  Reviewed past medical,surgical, social and family history.  Reviewed problem list, medications and allergies. Physical Assessment:   Vitals:   02/29/24 0808  BP: 121/89  Pulse: 65  SpO2: 99%  Weight: 165 lb 6.4 oz (75 kg)  Height: 5' 8 (1.727 m)  Body mass index is 25.15 kg/m.        Physical Examination:   General appearance - well appearing, and in no distress  Mental status - alert, oriented to person, place, and time  Psych:  She has a normal mood and affect  Skin - warm and dry, normal color, no suspicious lesions noted  Chest - effort normal, all lung fields clear to auscultation bilaterally  Heart - normal rate and regular rhythm  Neck:  midline trachea, no thyromegaly or nodules  Breasts - breasts appear normal, no suspicious masses, no skin or nipple changes or  axillary nodes  Abdomen - soft, nontender, nondistended, no masses or organomegaly  Pelvic - VULVA: normal appearing  vulva with no masses, tenderness or lesions   VAGINA: normal appearing vagina with normal color and discharge, no lesions   CERVIX: surgically absent  Thin prep pap is not indicated  UTERUS: surgically absent  ADNEXA: No adnexal masses or tenderness noted.  Rectal - normal rectal, good sphincter tone, no masses felt.   Extremities:  No swelling or varicosities noted  Chaperone present for exam  No results found for this or any previous visit (from the past 24 hours).  Assessment & Plan:  1. Well woman exam with routine gynecological exam (Primary) - Pap smear not indicated - Mammogram 04/2023.  Scheduled for November. - Colonoscopy 02/2024.  Follow up 7 years. - Bone mineral density 08/2023 - lab work done with PCP, Dr. Arloa - vaccines reviewed/updated  2. H/O: hysterectomy  3. Osteopenia of lumbar spine - discussed results and follow up  4. H/O retinal vein occlusion - taking ASA  5. Non-STEMI (non-ST elevated myocardial infarction) (HCC)   No orders of the defined types were placed in this encounter.   Meds: No orders of the defined types were placed in this encounter.   Follow-up: Return in about 1 year (around 02/28/2025).  Ronal GORMAN Pinal, MD 02/29/2024 8:52 AM

## 2024-02-29 NOTE — Patient Instructions (Signed)
 [facility]  Call for an Appointment [phone]

## 2024-03-01 LAB — GENECONNECT MOLECULAR SCREEN: Genetic Analysis Overall Interpretation: NEGATIVE

## 2024-03-01 NOTE — Progress Notes (Unsigned)
  Cardiology Office Note:   Date:  03/03/2024  ID:  Kaylee Banks, DOB 1960/12/11, MRN [id number] PCP: Arloa Elsie SAUNDERS, MD  [facility] Providers Cardiologist:  Lynwood Schilling, MD {  History of Present Illness:   Kaylee Banks is a 63 y.o. female who presents for a follow up of SCAD.   In 2019 she had a negative POET (Plain Old Exercise Treadmill).   She has done well.  The patient denies any new symptoms such as chest discomfort, neck or arm discomfort. There has been no new shortness of breath, PND or orthopnea. There have been no reported palpitations, presyncope or syncope.  She is walking for exercise.    ROS: As stated in the HPI and negative for all other systems.  Studies Reviewed:    EKG:   EKG Interpretation Date/Time:  Thursday March 03 2024 07:55:32 EDT Ventricular Rate:  55 PR Interval:  148 QRS Duration:  84 QT Interval:  450 QTC Calculation: 430 R Axis:   8  Text Interpretation: Sinus bradycardia Possible Left atrial enlargement Nonspecific ST abnormality When compared with ECG of 25-Feb-2023 08:47, No significant change was found Confirmed by Schilling Lynwood ([id number]) on 03/03/2024 7:59:06 AM   Risk Assessment/Calculations:              Physical Exam:   VS:  BP 120/80   Pulse (!) 55   Ht 5' 8 (1.727 m)   Wt 164 lb (74.4 kg)   LMP 05/10/2007   BMI 24.94 kg/m    Wt Readings from Last 3 Encounters:  03/03/24 164 lb (74.4 kg)  02/29/24 165 lb 6.4 oz (75 kg)  02/18/24 164 lb (74.4 kg)     GEN: Well nourished, well developed in no acute distress NECK: No JVD; No carotid bruits CARDIAC: RRR, no murmurs, rubs, gallops RESPIRATORY:  Clear to auscultation without rales, wheezing or rhonchi  ABDOMEN: Soft, non-tender, non-distended EXTREMITIES:  No edema; No deformity   ASSESSMENT AND PLAN:   SCAD:   The patient has no new sypmtoms.  No further cardiovascular testing is indicated.  We will continue with aggressive risk reduction and meds  as listed.   DYSLIPIDEMIA:   LDL is 81.  I would like for this to be in the 50.  I am going to increase her Crestor  to 20 mg daily with plans for repeat lipid and LP(a) in about 4 months.  Of note she does have some mild muscle aches and wants to trial herself off of the statin before going to the higher dose just to see if there is an association between daily muscle aches and her statin.   If she does not notice an association she will start 20 mg.  If she does notice an association she will let me know and I probably would switch her to [id number].     Follow up with me in one year.   Signed, Lynwood Schilling, MD

## 2024-03-03 ENCOUNTER — Encounter: Payer: Self-pay | Admitting: Cardiology

## 2024-03-03 ENCOUNTER — Ambulatory Visit: Attending: Cardiology | Admitting: Cardiology

## 2024-03-03 VITALS — BP 120/80 | HR 55 | Ht 68.0 in | Wt 164.0 lb

## 2024-03-03 DIAGNOSIS — I2542 Coronary artery dissection: Secondary | ICD-10-CM | POA: Diagnosis not present

## 2024-03-03 DIAGNOSIS — E785 Hyperlipidemia, unspecified: Secondary | ICD-10-CM | POA: Diagnosis not present

## 2024-03-03 NOTE — Patient Instructions (Signed)
 Medication Instructions:  Take Crestor  20 mg daily Continue all other medications *If you need a refill on your cardiac medications before your next appointment, please call your pharmacy*  Lab Work: Have done fasting LPa and lipid panel in 4 months  Testing/Procedures: None ordered  Follow-Up: At [facility], you and your health needs are our priority.  As part of our continuing mission to provide you with exceptional heart care, our providers are all part of one team.  This team includes your primary Cardiologist (physician) and Advanced Practice Providers or APPs (Physician Assistants and Nurse Practitioners) who all work together to provide you with the care you need, when you need it.  Your next appointment:  1 year    Call in May to schedule Sept appointment     Provider:  Dr.Hochrein   We recommend signing up for the patient portal called MyChart.  Sign up information is provided on this After Visit Summary.  MyChart is used to connect with patients for Virtual Visits (Telemedicine).  Patients are able to view lab/test results, encounter notes, upcoming appointments, etc.  Non-urgent messages can be sent to your provider as well.   To learn more about what you can do with MyChart, go to [email or web address].

## 2024-04-18 ENCOUNTER — Other Ambulatory Visit: Payer: Self-pay | Admitting: Cardiology

## 2024-04-27 ENCOUNTER — Ambulatory Visit
Admission: RE | Admit: 2024-04-27 | Discharge: 2024-04-27 | Disposition: A | Discharge status: Home / Self Care | Source: Ambulatory Visit | Attending: Obstetrics & Gynecology | Admitting: Obstetrics & Gynecology

## 2024-04-27 DIAGNOSIS — Z1231 Encounter for screening mammogram for malignant neoplasm of breast: Secondary | ICD-10-CM

## 2024-07-12 ENCOUNTER — Other Ambulatory Visit: Payer: Self-pay | Admitting: Cardiology

## 2025-03-03 ENCOUNTER — Ambulatory Visit (HOSPITAL_BASED_OUTPATIENT_CLINIC_OR_DEPARTMENT_OTHER): Admitting: Obstetrics & Gynecology
